# Patient Record
Sex: Male | Born: 1985 | Race: White | Hispanic: No | Marital: Married | Smoking: Current every day smoker
Health system: Southern US, Community
[De-identification: ages and names within clinical notes are randomized; demographics above are authoritative.]

## PROBLEM LIST (undated history)

## (undated) DIAGNOSIS — N200 Calculus of kidney: Secondary | ICD-10-CM

## (undated) DIAGNOSIS — I10 Essential (primary) hypertension: Secondary | ICD-10-CM

## (undated) DIAGNOSIS — F429 Obsessive-compulsive disorder, unspecified: Secondary | ICD-10-CM

## (undated) DIAGNOSIS — L732 Hidradenitis suppurativa: Secondary | ICD-10-CM

## (undated) DIAGNOSIS — E119 Type 2 diabetes mellitus without complications: Secondary | ICD-10-CM

## (undated) DIAGNOSIS — F4521 Hypochondriasis: Secondary | ICD-10-CM

## (undated) HISTORY — PX: WISDOM TOOTH EXTRACTION: SHX21

## (undated) HISTORY — PX: ANKLE SURGERY: SHX546

## (undated) HISTORY — PX: HERNIA REPAIR: SHX51

## (undated) HISTORY — PX: PILONIDAL CYST DRAINAGE: SHX743

---
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
## 2005-05-23 ENCOUNTER — Emergency Department (HOSPITAL_COMMUNITY): Admission: EM | Admit: 2005-05-23 | Discharge: 2005-05-23 | Payer: Self-pay | Admitting: Emergency Medicine

## 2005-05-26 ENCOUNTER — Ambulatory Visit: Payer: Self-pay

## 2012-01-03 ENCOUNTER — Encounter (HOSPITAL_COMMUNITY): Payer: Self-pay

## 2012-01-03 ENCOUNTER — Emergency Department (HOSPITAL_COMMUNITY)
Admission: EM | Admit: 2012-01-03 | Discharge: 2012-01-03 | Disposition: A | Discharge status: Home / Self Care | Payer: Self-pay | Source: Home / Self Care

## 2012-01-03 DIAGNOSIS — K047 Periapical abscess without sinus: Secondary | ICD-10-CM

## 2012-01-03 MED ORDER — IBUPROFEN 800 MG PO TABS: 800.0000 mg | ORAL_TABLET | Freq: Three times a day (TID) | ORAL | Status: AC

## 2012-01-03 MED ORDER — PENICILLIN V POTASSIUM 500 MG PO TABS: 500.0000 mg | ORAL_TABLET | Freq: Four times a day (QID) | ORAL | Status: AC

## 2012-01-03 MED ORDER — CHLORHEXIDINE GLUCONATE 0.12 % MT SOLN: 15.0000 mL | Freq: Two times a day (BID) | OROMUCOSAL | Status: AC

## 2012-01-03 NOTE — ED Provider Notes (Signed)
Medical screening examination/treatment/procedure(s) were performed by non-physician practitioner and as supervising physician I was immediately available for consultation/collaboration.  Luiz Blare MD   Luiz Blare, MD 01/03/12 [phone]

## 2012-01-03 NOTE — Discharge Instructions (Signed)
Your pain is most likely related to a dental abscess.  Please follow up with a dentist for further help.   Dental Abscess A dental abscess usually starts from an infected tooth. Antibiotic medicine and pain pills can be helpful, but dental infections require the attention of a dentist. Rinse around the infected area often with salt water (a pinch of salt in 8 oz of warm water). Do not apply heat to the outside of your face. See your dentist or oral surgeon as soon as possible.  SEEK IMMEDIATE MEDICAL CARE IF:  You have increasing, severe pain that is not relieved by medicine.   You or your child has an oral temperature above 102 F (38.9 C), not controlled by medicine.   Your baby is older than 3 months with a rectal temperature of 102 F (38.9 C) or higher.   Your baby is 62 months old or younger with a rectal temperature of 100.4 F (38 C) or higher.   You develop chills, severe headache, difficulty breathing, or trouble swallowing.   You have swelling in the neck or around the eye.  Document Released: 08/03/2005 Document Revised: 07/23/2011 Document Reviewed: 01/12/2007 [facility] Patient Information 2012 [place], [place].

## 2012-01-03 NOTE — ED Notes (Signed)
Pt has painful, swollen lesion in lt cheek for three weeks, lt ear now starting to hurt.

## 2012-01-03 NOTE — ED Provider Notes (Signed)
History     CSN: [id number]  Arrival date & time 01/03/12  0944   None     Chief Complaint  Patient presents with  . Oral Swelling    (Consider location/radiation/quality/duration/timing/severity/associated sxs/prior treatment) Patient is a 26 y.o. male presenting with tooth pain. The history is provided by the patient.  Dental PainThe primary symptoms include mouth pain. Primary symptoms do not include dental injury, fever, sore throat or cough. Episode onset: 3 weeks ago. The symptoms are worsening. The symptoms are new. The symptoms occur constantly.  Additional symptoms include: gum swelling, gum tenderness and ear pain. Additional symptoms do not include: purulent gums, facial swelling and swollen glands. Associated symptoms comments: Pt feels area swells sometimes, then pt has a bad taste in his mouth and swelling subsides; this then repeats. Feels pain is worsening, and now feels ear is sore sometimes. Medical issues include: smoking.    History reviewed. No pertinent past medical history.  Past Surgical History  Procedure Date  . Pilonidal cyst drainage   . Ankle surgery     History reviewed. No pertinent family history.  History  Substance Use Topics  . Smoking status: Current Everyday Smoker -- 1.0 packs/day  . Smokeless tobacco: Not on file  . Alcohol Use: No      Review of Systems  Constitutional: Negative for fever and chills.  HENT: Positive for ear pain, mouth sores and dental problem. Negative for sore throat, facial swelling, neck pain and ear discharge.   Respiratory: Negative for cough.   Skin: Negative for rash and wound.    Allergies  Review of patient's allergies indicates no known allergies.  Home Medications   Current Outpatient Rx  Name Route Sig Dispense Refill  . CHLORHEXIDINE GLUCONATE 0.12 % MT SOLN Mouth/Throat Use as directed 15 mLs in the mouth or throat 2 (two) times daily. 120 mL 0  . IBUPROFEN 800 MG PO TABS Oral Take 1 tablet  (800 mg total) by mouth 3 (three) times daily. 21 tablet 0  . PENICILLIN V POTASSIUM 500 MG PO TABS Oral Take 1 tablet (500 mg total) by mouth 4 (four) times daily. 40 tablet 0    BP 119/76  Pulse 99  Temp(Src) 97.8 F (36.6 C) (Oral)  Resp 16  SpO2 100%  Physical Exam  Constitutional: He appears well-developed and well-nourished. No distress.       Morbidly obese  HENT:  Right Ear: Tympanic membrane, external ear and ear canal normal.  Left Ear: Tympanic membrane, external ear and ear canal normal.  Mouth/Throat: Oropharynx is clear and moist and mucous membranes are normal.    Lymphadenopathy:       Head (right side): No submental, no submandibular, no tonsillar, no preauricular and no posterior auricular adenopathy present.       Head (left side): No submental, no submandibular, no tonsillar, no preauricular and no posterior auricular adenopathy present.    He has no cervical adenopathy.    ED Course  Procedures (including critical care time)  Labs Reviewed - No data to display No results found.   1. Dental abscess       MDM          Cathlyn Parsons, NP 01/03/12 1147

## 2012-05-27 DIAGNOSIS — Z87442 Personal history of urinary calculi: Secondary | ICD-10-CM | POA: Insufficient documentation

## 2012-05-27 DIAGNOSIS — F17200 Nicotine dependence, unspecified, uncomplicated: Secondary | ICD-10-CM | POA: Insufficient documentation

## 2012-05-27 DIAGNOSIS — N23 Unspecified renal colic: Secondary | ICD-10-CM | POA: Insufficient documentation

## 2012-05-28 ENCOUNTER — Encounter (HOSPITAL_COMMUNITY): Payer: Self-pay | Admitting: Emergency Medicine

## 2012-05-28 ENCOUNTER — Emergency Department (HOSPITAL_COMMUNITY)
Admission: EM | Admit: 2012-05-28 | Discharge: 2012-05-28 | Disposition: A | Discharge status: Home / Self Care | Payer: Self-pay | Attending: Emergency Medicine | Admitting: Emergency Medicine

## 2012-05-28 DIAGNOSIS — N23 Unspecified renal colic: Secondary | ICD-10-CM

## 2012-05-28 LAB — URINALYSIS, ROUTINE W REFLEX MICROSCOPIC
Bilirubin Urine: NEGATIVE
Glucose, UA: NEGATIVE mg/dL
Ketones, ur: NEGATIVE mg/dL
Leukocytes, UA: NEGATIVE
Nitrite: NEGATIVE
Protein, ur: NEGATIVE mg/dL
Specific Gravity, Urine: 1.014 (ref 1.005–1.030)
Urobilinogen, UA: 0.2 mg/dL (ref 0.0–1.0)
pH: 5.5 (ref 5.0–8.0)

## 2012-05-28 LAB — URINE MICROSCOPIC-ADD ON

## 2012-05-28 MED ORDER — FENTANYL CITRATE 0.05 MG/ML IJ SOLN
50.0000 ug | Freq: Once | INTRAMUSCULAR | Status: AC
  Administered 2012-05-28: 50 ug via INTRAVENOUS
  Filled 2012-05-28: qty 2

## 2012-05-28 MED ORDER — ONDANSETRON HCL 4 MG PO TABS: 4.0000 mg | ORAL_TABLET | Freq: Four times a day (QID) | ORAL | Status: DC

## 2012-05-28 MED ORDER — OXYCODONE-ACETAMINOPHEN 5-325 MG PO TABS: 1.0000 | ORAL_TABLET | ORAL | Status: DC | PRN

## 2012-05-28 NOTE — ED Provider Notes (Signed)
History     CSN: [id number]  Arrival date & time 05/27/12  2353   First MD Initiated Contact with Patient 05/28/12 0236      Chief Complaint  Patient presents with  . Flank Pain    (Consider location/radiation/quality/duration/timing/severity/associated sxs/prior treatment) Patient is a 26 y.o. male presenting with flank pain. The history is provided by the patient.  Flank Pain This is a new problem. The current episode started today. The problem occurs constantly. Associated symptoms include nausea and vomiting. Pertinent negatives include no chills or fever. Associated symptoms comments: Right flank pain that started over the last several days and yesterday morning woke patient after it radiated with significant pain into right groin area. Current symptoms are similar to previous history of kidney stones. No fever. . Nothing aggravates the symptoms.    History reviewed. No pertinent past medical history.  Past Surgical History  Procedure Date  . Pilonidal cyst drainage   . Ankle surgery     No family history on file.  History  Substance Use Topics  . Smoking status: Current Every Day Smoker -- 1.0 packs/day  . Smokeless tobacco: Not on file  . Alcohol Use: No      Review of Systems  Constitutional: Negative for fever and chills.  HENT: Negative.   Respiratory: Negative.   Cardiovascular: Negative.   Gastrointestinal: Positive for nausea and vomiting.  Genitourinary: Positive for flank pain.  Musculoskeletal: Negative.   Skin: Negative.   Neurological: Negative.     Allergies  Review of patient's allergies indicates no known allergies.  Home Medications   Current Outpatient Rx  Name Route Sig Dispense Refill  . ACETAMINOPHEN 500 MG PO TABS Oral Take 1,000 mg by mouth every 6 (six) hours as needed. For pain    . OMEPRAZOLE 20 MG PO CPDR Oral Take 20 mg by mouth daily.      BP 147/82  Pulse 99  Resp 16  Wt 285 lb (129.275 kg)  SpO2 98%  Physical  Exam  Constitutional: He is oriented to person, place, and time. He appears well-developed and well-nourished.  HENT:  Head: Normocephalic.  Neck: Normal range of motion. Neck supple.  Cardiovascular: Normal rate and regular rhythm.   Pulmonary/Chest: Effort normal and breath sounds normal.  Abdominal: Soft. Bowel sounds are normal. There is no tenderness. There is no rebound and no guarding.  Genitourinary:       Penis without discharge. No testicular swelling, mass or tenderness.   Musculoskeletal: Normal range of motion.  Neurological: He is alert and oriented to person, place, and time.  Skin: Skin is warm and dry. No rash noted.  Psychiatric: He has a normal mood and affect.    ED Course  Procedures (including critical care time)  Labs Reviewed  URINALYSIS, ROUTINE W REFLEX MICROSCOPIC - Abnormal; Notable for the following:    APPearance CLOUDY (*)     Hgb urine dipstick LARGE (*)     All other components within normal limits  URINE MICROSCOPIC-ADD ON   Results for orders placed during the hospital encounter of 05/28/12  URINALYSIS, ROUTINE W REFLEX MICROSCOPIC      Component Value Range   Color, Urine YELLOW  YELLOW   APPearance CLOUDY (*) CLEAR   Specific Gravity, Urine 1.014  1.005 - 1.030   pH 5.5  5.0 - 8.0   Glucose, UA NEGATIVE  NEGATIVE mg/dL   Hgb urine dipstick LARGE (*) NEGATIVE   Bilirubin Urine NEGATIVE  NEGATIVE  Ketones, ur NEGATIVE  NEGATIVE mg/dL   Protein, ur NEGATIVE  NEGATIVE mg/dL   Urobilinogen, UA 0.2  0.0 - 1.0 mg/dL   Nitrite NEGATIVE  NEGATIVE   Leukocytes, UA NEGATIVE  NEGATIVE  URINE MICROSCOPIC-ADD ON      Component Value Range   RBC / HPF TOO NUMEROUS TO COUNT  <3 RBC/hpf   Bacteria, UA RARE  RARE    No results found.   No diagnosis found.  1. Ureteral colic   MDM  Patient with a history of kidney stones with similar pain that is well controlled in ED. No vomiting. Felt CT scan would not contribute to treatment and will  discharge with referral to urology.        Rodena Medin, PA-C 05/28/12 [phone]

## 2012-05-28 NOTE — ED Notes (Signed)
Pt alert, arrives from home, c/o right flank pain, onset several days ago, states hx of stones, resp even unlabored, skin pwd

## 2012-05-28 NOTE — ED Provider Notes (Signed)
Medical screening examination/treatment/procedure(s) were performed by non-physician practitioner and as supervising physician I was immediately available for consultation/collaboration.  Tobin Chad, MD 05/28/12 [phone]

## 2012-05-28 NOTE — ED Notes (Signed)
Pt states that he has a kidney stone. He has not seen a MD but has a hx of multiple stones.

## 2012-06-03 ENCOUNTER — Encounter (HOSPITAL_COMMUNITY): Payer: Self-pay | Admitting: Emergency Medicine

## 2012-06-03 ENCOUNTER — Emergency Department (HOSPITAL_COMMUNITY)
Admission: EM | Admit: 2012-06-03 | Discharge: 2012-06-04 | Disposition: A | Discharge status: Home / Self Care | Payer: Self-pay | Attending: Emergency Medicine | Admitting: Emergency Medicine

## 2012-06-03 DIAGNOSIS — F17200 Nicotine dependence, unspecified, uncomplicated: Secondary | ICD-10-CM | POA: Insufficient documentation

## 2012-06-03 DIAGNOSIS — N23 Unspecified renal colic: Secondary | ICD-10-CM

## 2012-06-03 DIAGNOSIS — N201 Calculus of ureter: Secondary | ICD-10-CM | POA: Insufficient documentation

## 2012-06-03 NOTE — ED Notes (Signed)
Pt c/o right flank pain that started last Friday  Pt was seen here and was diagnosed with a kidney stone  Pt was seen by urology on Monday and found a 4mm stone   Pt states yesterday the pain started to increase  Pt states the pain is localized in one place  Pt is c/o nausea  Pt states he used his medication for the nausea   Pt states he is having urinary frequency and states it is very painful to urinate

## 2012-06-04 LAB — URINALYSIS, ROUTINE W REFLEX MICROSCOPIC
Bilirubin Urine: NEGATIVE
Glucose, UA: NEGATIVE mg/dL
Ketones, ur: NEGATIVE mg/dL
Protein, ur: NEGATIVE mg/dL
Specific Gravity, Urine: 1.019 (ref 1.005–1.030)
pH: 6 (ref 5.0–8.0)

## 2012-06-04 LAB — URINE MICROSCOPIC-ADD ON

## 2012-06-04 MED ORDER — HYDROMORPHONE HCL PF 2 MG/ML IJ SOLN
2.0000 mg | Freq: Once | INTRAMUSCULAR | Status: AC
  Administered 2012-06-04: 2 mg via INTRAVENOUS
  Filled 2012-06-04: qty 1

## 2012-06-04 MED ORDER — NAPROXEN SODIUM 220 MG PO TABS: ORAL_TABLET | ORAL | Status: DC

## 2012-06-04 MED ORDER — ONDANSETRON 8 MG PO TBDP
8.0000 mg | ORAL_TABLET | Freq: Once | ORAL | Status: AC
  Administered 2012-06-04: 8 mg via ORAL
  Filled 2012-06-04: qty 2

## 2012-06-04 MED ORDER — HYDROMORPHONE HCL 4 MG PO TABS: 4.0000 mg | ORAL_TABLET | ORAL | Status: DC | PRN

## 2012-06-04 MED ORDER — IBUPROFEN 800 MG PO TABS
800.0000 mg | ORAL_TABLET | Freq: Once | ORAL | Status: AC
Start: 2012-06-04 — End: 2012-06-04
  Administered 2012-06-04: 800 mg via ORAL
  Filled 2012-06-04: qty 1

## 2012-06-04 NOTE — ED Provider Notes (Signed)
History     CSN: [id number]  Arrival date & time 06/03/12  2337   First MD Initiated Contact with Patient 06/04/12 0058      Chief Complaint  Patient presents with  . Flank Pain    (Consider location/radiation/quality/duration/timing/severity/associated sxs/prior treatment) HPI This is a 26 year old white male with a history of nephrolithiasis. He's had a one-week history of right flank pain. He was seen in the ED for this and subsequently seen by urology 5 days ago. He has a known 4 mm right ureteral stone. He is here because the pain is worsened over the last 2 days and changed in character. He states before it was a dull pain but is now a sharp pain. It is felt primarily in his right testicle, radiating to the right flank. He also has sensation of urinary urgency and experiences urethral pain with urination. He has not gotten adequate relief with his oxycodone and rapid flow at home. He is not aware of having a fever. He has had nausea with this.  History reviewed. No pertinent past medical history.  Past Surgical History  Procedure Date  . Pilonidal cyst drainage   . Ankle surgery     Family History  Problem Relation Age of Onset  . Cancer Other   . Hypertension Other     History  Substance Use Topics  . Smoking status: Current Every Day Smoker -- 1.0 packs/day    Types: Cigarettes  . Smokeless tobacco: Not on file  . Alcohol Use: No      Review of Systems  All other systems reviewed and are negative.    Allergies  Review of patient's allergies indicates no known allergies.  Home Medications   Current Outpatient Rx  Name Route Sig Dispense Refill  . ACETAMINOPHEN 500 MG PO TABS Oral Take 1,000 mg by mouth every 6 (six) hours as needed. For pain    . OMEPRAZOLE 20 MG PO CPDR Oral Take 20 mg by mouth daily.    Marland Kitchen ONDANSETRON HCL 4 MG PO TABS Oral Take 4 mg by mouth every 8 (eight) hours as needed. For nausea    . OXYCODONE-ACETAMINOPHEN 5-325 MG PO TABS Oral  Take 1 tablet by mouth every 4 (four) hours as needed for pain. 15 tablet 0  . SILODOSIN 4 MG PO CAPS Oral Take 4 mg by mouth daily with breakfast.      BP 154/89  Pulse 107  Temp 98.1 F (36.7 C) (Oral)  Resp 20  SpO2 97%  Physical Exam General: Well-developed, obese male in no acute distress; appearance consistent with age of record HENT: normocephalic, atraumatic Eyes: pupils equal round and reactive to light; extraocular muscles intact Neck: supple Heart: regular rate and rhythm Lungs: clear to auscultation bilaterally Abdomen: soft; obese; nontender; bowel sounds present GU: Right flank tenderness Extremities: No deformity; full range of motion; trace edema of lower legs Neurologic: Awake, alert and oriented; motor function intact in all extremities and symmetric; no facial droop Skin: Warm and dry Psychiatric: Normal mood and affect    ED Course  Procedures (including critical care time)     MDM   Nursing notes and vitals signs, including pulse oximetry, reviewed.  Summary of this visit's results, reviewed by myself:  Labs:  Results for orders placed during the hospital encounter of 06/03/12  URINALYSIS, ROUTINE W REFLEX MICROSCOPIC      Component Value Range   Color, Urine YELLOW  YELLOW   APPearance CLEAR  CLEAR  Specific Gravity, Urine 1.019  1.005 - 1.030   pH 6.0  5.0 - 8.0   Glucose, UA NEGATIVE  NEGATIVE mg/dL   Hgb urine dipstick MODERATE (*) NEGATIVE   Bilirubin Urine NEGATIVE  NEGATIVE   Ketones, ur NEGATIVE  NEGATIVE mg/dL   Protein, ur NEGATIVE  NEGATIVE mg/dL   Urobilinogen, UA 0.2  0.0 - 1.0 mg/dL   Nitrite NEGATIVE  NEGATIVE   Leukocytes, UA SMALL (*) NEGATIVE  URINE MICROSCOPIC-ADD ON      Component Value Range   Squamous Epithelial / LPF RARE  RARE   WBC, UA 3-6  <3 WBC/hpf   RBC / HPF 11-20  <3 RBC/hpf   2:08 AM Pain improved after IM Dilaudid and ibuprofen by mouth. We will adjust his medications.        Hanley Seamen,  MD 06/04/12 [phone]

## 2012-06-05 LAB — URINE CULTURE
Colony Count: NO GROWTH
Culture: NO GROWTH

## 2012-09-11 ENCOUNTER — Encounter (HOSPITAL_COMMUNITY): Payer: Self-pay | Admitting: *Deleted

## 2012-09-11 ENCOUNTER — Emergency Department (HOSPITAL_COMMUNITY)
Admission: EM | Admit: 2012-09-11 | Discharge: 2012-09-11 | Disposition: A | Discharge status: Home / Self Care | Payer: Self-pay | Attending: Emergency Medicine | Admitting: Emergency Medicine

## 2012-09-11 DIAGNOSIS — R110 Nausea: Secondary | ICD-10-CM | POA: Insufficient documentation

## 2012-09-11 DIAGNOSIS — N390 Urinary tract infection, site not specified: Secondary | ICD-10-CM | POA: Insufficient documentation

## 2012-09-11 DIAGNOSIS — M549 Dorsalgia, unspecified: Secondary | ICD-10-CM | POA: Insufficient documentation

## 2012-09-11 DIAGNOSIS — F17200 Nicotine dependence, unspecified, uncomplicated: Secondary | ICD-10-CM | POA: Insufficient documentation

## 2012-09-11 DIAGNOSIS — N200 Calculus of kidney: Secondary | ICD-10-CM | POA: Insufficient documentation

## 2012-09-11 DIAGNOSIS — R319 Hematuria, unspecified: Secondary | ICD-10-CM | POA: Insufficient documentation

## 2012-09-11 DIAGNOSIS — R350 Frequency of micturition: Secondary | ICD-10-CM | POA: Insufficient documentation

## 2012-09-11 DIAGNOSIS — R300 Dysuria: Secondary | ICD-10-CM | POA: Insufficient documentation

## 2012-09-11 DIAGNOSIS — Z79899 Other long term (current) drug therapy: Secondary | ICD-10-CM | POA: Insufficient documentation

## 2012-09-11 DIAGNOSIS — N23 Unspecified renal colic: Secondary | ICD-10-CM | POA: Insufficient documentation

## 2012-09-11 DIAGNOSIS — E669 Obesity, unspecified: Secondary | ICD-10-CM | POA: Insufficient documentation

## 2012-09-11 DIAGNOSIS — R3915 Urgency of urination: Secondary | ICD-10-CM | POA: Insufficient documentation

## 2012-09-11 DIAGNOSIS — R109 Unspecified abdominal pain: Secondary | ICD-10-CM

## 2012-09-11 HISTORY — DX: Calculus of kidney: N20.0

## 2012-09-11 LAB — URINALYSIS, ROUTINE W REFLEX MICROSCOPIC
Bilirubin Urine: NEGATIVE
Glucose, UA: NEGATIVE mg/dL
Hgb urine dipstick: NEGATIVE
Nitrite: NEGATIVE
Protein, ur: NEGATIVE mg/dL
Urobilinogen, UA: 1 mg/dL (ref 0.0–1.0)
pH: 8.5 — ABNORMAL HIGH (ref 5.0–8.0)

## 2012-09-11 LAB — URINE MICROSCOPIC-ADD ON

## 2012-09-11 MED ORDER — OXYCODONE-ACETAMINOPHEN 5-325 MG PO TABS
2.0000 | ORAL_TABLET | Freq: Once | ORAL | Status: AC
  Administered 2012-09-11: 2 via ORAL
  Filled 2012-09-11: qty 2

## 2012-09-11 MED ORDER — OXYCODONE-ACETAMINOPHEN 5-325 MG PO TABS: 1.0000 | ORAL_TABLET | Freq: Four times a day (QID) | ORAL | Status: DC | PRN

## 2012-09-11 MED ORDER — CIPROFLOXACIN HCL 500 MG PO TABS: 500.0000 mg | ORAL_TABLET | Freq: Two times a day (BID) | ORAL | Status: DC

## 2012-09-11 NOTE — ED Notes (Signed)
Pt reports having a kidney stone, hx of same. Having left side back pain, groin pain, blood in urine, nausea, difficulty urinating x 3 days.

## 2012-09-11 NOTE — ED Provider Notes (Signed)
Medical screening examination/treatment/procedure(s) were performed by non-physician practitioner and as supervising physician I was immediately available for consultation/collaboration.  Tobin Chad, MD 09/11/12 [phone]

## 2012-09-11 NOTE — ED Provider Notes (Signed)
History     CSN: [id number]  Arrival date & time 09/11/12  1413   First MD Initiated Contact with Patient 09/11/12 1425      Chief Complaint  Patient presents with  . Nephrolithiasis    (Consider location/radiation/quality/duration/timing/severity/associated sxs/prior treatment) HPI Comments: 27 y/o male with a history of kidney stones presents to the ED complaining of left flank pain x 4 days. Flank pain described as constant and achy rated 7/10 radiating to his groin intermittently. Admits to associated nausea without vomiting. Also states he had hematuria yesterday along with dysuria, increased frequency and urgency. He tried taking zofran for nausea which has helped as well as percocet from his last kidney stone which provided relief. He has a history of kidney stones on left verified by CT scan at [facility]. He has been able to pass his prior stones. Denies fever or chills.  The history is provided by the patient.    Past Medical History  Diagnosis Date  . Kidney stone     Past Surgical History  Procedure Date  . Pilonidal cyst drainage   . Ankle surgery     Family History  Problem Relation Age of Onset  . Cancer Other   . Hypertension Other     History  Substance Use Topics  . Smoking status: Current Every Day Smoker -- 1.0 packs/day    Types: Cigarettes  . Smokeless tobacco: Not on file  . Alcohol Use: No      Review of Systems  Constitutional: Negative for fever and chills.  Gastrointestinal: Positive for nausea. Negative for vomiting and diarrhea.  Genitourinary: Positive for dysuria, urgency, frequency, hematuria and flank pain.  Musculoskeletal: Positive for back pain.  All other systems reviewed and are negative.    Allergies  Review of patient's allergies indicates no known allergies.  Home Medications   Current Outpatient Rx  Name  Route  Sig  Dispense  Refill  . ACETAMINOPHEN 500 MG PO TABS   Oral   Take 1,000 mg by mouth every 6  (six) hours as needed. For pain         . HYDROMORPHONE HCL 4 MG PO TABS   Oral   Take 1 tablet (4 mg total) by mouth every 4 (four) hours as needed for pain.   30 tablet   0   . NAPROXEN SODIUM 220 MG PO TABS      Take 2 tablets twice daily until stone passes. Best taken with a meal.         . OMEPRAZOLE 20 MG PO CPDR   Oral   Take 20 mg by mouth daily.         Marland Kitchen ONDANSETRON HCL 4 MG PO TABS   Oral   Take 4 mg by mouth every 8 (eight) hours as needed. For nausea         . OXYCODONE-ACETAMINOPHEN 5-325 MG PO TABS   Oral   Take 1 tablet by mouth every 4 (four) hours as needed for pain.   15 tablet   0   . SILODOSIN 4 MG PO CAPS   Oral   Take 4 mg by mouth daily with breakfast.           BP 142/89  Pulse 103  Temp 97.7 F (36.5 C) (Oral)  Resp 18  SpO2 98%  Physical Exam  Nursing note and vitals reviewed. Constitutional: He is oriented to person, place, and time. He appears well-developed. No distress.  Obese   HENT:  Head: Normocephalic and atraumatic.  Mouth/Throat: Oropharynx is clear and moist.  Eyes: Conjunctivae normal are normal.  Neck: Normal range of motion. Neck supple.  Cardiovascular: Regular rhythm, normal heart sounds and intact distal pulses.  Tachycardia present.   Pulmonary/Chest: Effort normal and breath sounds normal.  Abdominal: Soft. Bowel sounds are normal. There is CVA tenderness (left). There is no rigidity, no rebound and no guarding.    Musculoskeletal: Normal range of motion. He exhibits no edema.  Neurological: He is alert and oriented to person, place, and time.  Skin: Skin is warm and dry.  Psychiatric: He has a normal mood and affect. His behavior is normal.    ED Course  Procedures (including critical care time)  Labs Reviewed  URINALYSIS, ROUTINE W REFLEX MICROSCOPIC - Abnormal; Notable for the following:    pH 8.5 (*)     Leukocytes, UA SMALL (*)     All other components within normal limits  URINE  MICROSCOPIC-ADD ON - Abnormal; Notable for the following:    Squamous Epithelial / LPF FEW (*)     All other components within normal limits   No results found.   1. Ureteral colic   2. Flank pain   3. UTI (urinary tract infection)       MDM  27 y/o male with flank pain the same as his prior kidney stones. CT obtained by his urologist confirming stone on left per patient. Urine with UTI. Will treat with cipro. Percocet given for pain in ED. Rx percocet for pain. Urine strainer given. Return precautions discussed. Patient stable for discharge.         Trevor Mace, PA-C 09/11/12 1512

## 2013-02-18 ENCOUNTER — Encounter (HOSPITAL_COMMUNITY): Payer: Self-pay | Admitting: *Deleted

## 2013-02-18 ENCOUNTER — Emergency Department (HOSPITAL_COMMUNITY)
Admission: EM | Admit: 2013-02-18 | Discharge: 2013-02-18 | Disposition: A | Discharge status: Home / Self Care | Payer: Self-pay | Attending: Emergency Medicine | Admitting: Emergency Medicine

## 2013-02-18 DIAGNOSIS — K0889 Other specified disorders of teeth and supporting structures: Secondary | ICD-10-CM

## 2013-02-18 DIAGNOSIS — K089 Disorder of teeth and supporting structures, unspecified: Secondary | ICD-10-CM | POA: Insufficient documentation

## 2013-02-18 DIAGNOSIS — J029 Acute pharyngitis, unspecified: Secondary | ICD-10-CM

## 2013-02-18 DIAGNOSIS — Z87442 Personal history of urinary calculi: Secondary | ICD-10-CM | POA: Insufficient documentation

## 2013-02-18 DIAGNOSIS — R0982 Postnasal drip: Secondary | ICD-10-CM | POA: Insufficient documentation

## 2013-02-18 DIAGNOSIS — F17200 Nicotine dependence, unspecified, uncomplicated: Secondary | ICD-10-CM | POA: Insufficient documentation

## 2013-02-18 LAB — RAPID STREP SCREEN (MED CTR MEBANE ONLY): Streptococcus, Group A Screen (Direct): NEGATIVE

## 2013-02-18 MED ORDER — OXYCODONE-ACETAMINOPHEN 5-325 MG PO TABS: 1.0000 | ORAL_TABLET | ORAL | Status: DC | PRN

## 2013-02-18 MED ORDER — AMOXICILLIN 500 MG PO CAPS: 500.0000 mg | ORAL_CAPSULE | Freq: Three times a day (TID) | ORAL | Status: DC

## 2013-02-18 MED ORDER — IBUPROFEN 800 MG PO TABS: 800.0000 mg | ORAL_TABLET | Freq: Three times a day (TID) | ORAL | Status: DC

## 2013-02-18 NOTE — ED Provider Notes (Signed)
History    This chart was scribed for Arthor Captain, non-physician practitioner working with Dione Booze, MD by Leone Payor, ED Scribe. This patient was seen in room TR08C/TR08C and the patient's care was started at 1859.  CSN: [id number] Arrival date & time 02/18/13  1859  First MD Initiated Contact with Patient 02/18/13 1918     Chief Complaint  Patient presents with  . 2 complaints     The history is provided by the patient. No language interpreter was used.    HPI Comments: Andrew Gibson is a 27 y.o. male who presents to the Emergency Department complaining of a constant, gradually worsening, L upper dental pain starting 2 weeks ago. States he has a broken tooth that has been chipping away recently. He describes the pain as dull and throbbing. Pressure and sweet foods aggravate the pain. Hot and cold do not affect the pain.   Pt also complains of a gradual onset, gradually worsening, constant sore throat starting 4 days ago. He denies any difficulty swallowing. He has noticed some drainage in the back of throat. Pt has h/o strep throat. He denies fever, chills, rashes, myalgia, joint pain, coughing, wheezing, chest pain.      Past Medical History  Diagnosis Date  . Kidney stone    Past Surgical History  Procedure Laterality Date  . Pilonidal cyst drainage    . Ankle surgery     Family History  Problem Relation Age of Onset  . Cancer Other   . Hypertension Other    History  Substance Use Topics  . Smoking status: Current Every Day Smoker -- 1.00 packs/day    Types: Cigarettes  . Smokeless tobacco: Not on file  . Alcohol Use: No    Review of Systems  Constitutional: Negative for fever and chills.  HENT: Positive for sore throat, dental problem and postnasal drip. Negative for trouble swallowing.   Respiratory: Negative for cough and wheezing.   Cardiovascular: Negative for chest pain.  Musculoskeletal: Negative for myalgias and joint swelling.  Skin: Negative for  rash.    Allergies  Review of patient's allergies indicates no known allergies.  Home Medications   Current Outpatient Rx  Name  Route  Sig  Dispense  Refill  . ibuprofen (ADVIL,MOTRIN) 200 MG tablet   Oral   Take 800-1,000 mg by mouth daily as needed. For pain         . Menthol (HALLS COUGH DROPS) 5.8 MG LOZG   Mouth/Throat   Use as directed 1 lozenge in the mouth or throat daily as needed. For sore throat         . Pseudoephedrine-APAP-DM (DAYQUIL PO)   Oral   Take 1 capsule by mouth daily as needed. For cold symptoms          BP   Pulse 89  Temp(Src) 97 F (36.1 C) (Oral)  Resp 18  SpO2 97% Physical Exam  Nursing note and vitals reviewed. Constitutional: He is oriented to person, place, and time. He appears well-developed and well-nourished. No distress.  HENT:  Head: Normocephalic and atraumatic.  Mouth/Throat: Oropharyngeal exudate present.  Cracked R upper molar. No erythema in the gumline, no drainage or induration.   Tonsillar exudate. Some swelling and erythema. Uvula is midline. Airway is patent.  Eyes: EOM are normal.  Neck: Neck supple. No tracheal deviation present.  Cardiovascular: Normal rate, regular rhythm and normal heart sounds.   Pulmonary/Chest: Effort normal and breath sounds normal. No respiratory distress.  He has no wheezes. He has no rales. He exhibits no tenderness.  Musculoskeletal: Normal range of motion.  Lymphadenopathy:    He has no cervical adenopathy.  Neurological: He is alert and oriented to person, place, and time.  Skin: Skin is warm and dry.  Psychiatric: He has a normal mood and affect. His behavior is normal.    ED Course  Procedures (including critical care time)  DIAGNOSTIC STUDIES: Oxygen Saturation is 97% on RA, normal by my interpretation.    COORDINATION OF CARE: 7:28 PM Discussed treatment plan with pt at bedside and pt agreed to plan.   Labs Reviewed  RAPID STREP SCREEN   No results found. 1. Pain,  dental   2. Pharyngitis     MDM  Patient with toothache.  No gross abscess.  Exam unconcerning for Ludwig's angina or spread of infection.  Will treat with penicillin and pain medicine.  Urged patient to follow-up with dentist.   Pharyngitis with exudate. Negative strep. D/c with amoxicillin for dental infection. Pain meds and dental f/u .  I personally performed the services described in this documentation, which was scribed in my presence. The recorded information has been reviewed and is accurate.    Arthor Captain, PA-C 02/18/13 2330

## 2013-02-18 NOTE — ED Provider Notes (Signed)
Medical screening examination/treatment/procedure(s) were performed by non-physician practitioner and as supervising physician I was immediately available for consultation/collaboration.   Dione Booze, MD 02/18/13 [phone]

## 2013-02-18 NOTE — ED Notes (Signed)
The pt has hjad a toothache for 2 weeks and a sore throat since wednesday

## 2013-02-20 LAB — CULTURE, GROUP A STREP

## 2013-06-02 ENCOUNTER — Emergency Department (HOSPITAL_COMMUNITY): Payer: Self-pay

## 2013-06-02 ENCOUNTER — Encounter (HOSPITAL_COMMUNITY): Payer: Self-pay | Admitting: Emergency Medicine

## 2013-06-02 ENCOUNTER — Emergency Department (HOSPITAL_COMMUNITY)
Admission: EM | Admit: 2013-06-02 | Discharge: 2013-06-02 | Disposition: A | Discharge status: Home / Self Care | Payer: Self-pay | Attending: Emergency Medicine | Admitting: Emergency Medicine

## 2013-06-02 DIAGNOSIS — M545 Low back pain, unspecified: Secondary | ICD-10-CM | POA: Insufficient documentation

## 2013-06-02 DIAGNOSIS — K439 Ventral hernia without obstruction or gangrene: Secondary | ICD-10-CM

## 2013-06-02 DIAGNOSIS — Y92410 Unspecified street and highway as the place of occurrence of the external cause: Secondary | ICD-10-CM | POA: Insufficient documentation

## 2013-06-02 DIAGNOSIS — Z79899 Other long term (current) drug therapy: Secondary | ICD-10-CM | POA: Insufficient documentation

## 2013-06-02 DIAGNOSIS — Y9389 Activity, other specified: Secondary | ICD-10-CM | POA: Insufficient documentation

## 2013-06-02 DIAGNOSIS — S301XXA Contusion of abdominal wall, initial encounter: Secondary | ICD-10-CM | POA: Insufficient documentation

## 2013-06-02 DIAGNOSIS — K449 Diaphragmatic hernia without obstruction or gangrene: Secondary | ICD-10-CM | POA: Insufficient documentation

## 2013-06-02 DIAGNOSIS — Z87442 Personal history of urinary calculi: Secondary | ICD-10-CM | POA: Insufficient documentation

## 2013-06-02 DIAGNOSIS — F17200 Nicotine dependence, unspecified, uncomplicated: Secondary | ICD-10-CM | POA: Insufficient documentation

## 2013-06-02 LAB — CBC WITH DIFFERENTIAL/PLATELET
Basophils Absolute: 0 10*3/uL (ref 0.0–0.1)
Basophils Relative: 0 % (ref 0–1)
Eosinophils Absolute: 0.3 10*3/uL (ref 0.0–0.7)
Eosinophils Relative: 3 % (ref 0–5)
HCT: 40.2 % (ref 39.0–52.0)
Hemoglobin: 13.4 g/dL (ref 13.0–17.0)
Lymphocytes Relative: 24 % (ref 12–46)
Lymphs Abs: 2.5 10*3/uL (ref 0.7–4.0)
MCH: 27.5 pg (ref 26.0–34.0)
MCHC: 33.3 g/dL (ref 30.0–36.0)
MCV: 82.4 fL (ref 78.0–100.0)
Monocytes Absolute: 0.5 10*3/uL (ref 0.1–1.0)
Monocytes Relative: 5 % (ref 3–12)
Neutro Abs: 7 10*3/uL (ref 1.7–7.7)
Neutrophils Relative %: 68 % (ref 43–77)
Platelets: 274 10*3/uL (ref 150–400)
RBC: 4.88 MIL/uL (ref 4.22–5.81)
RDW: 14.7 % (ref 11.5–15.5)
WBC: 10.3 10*3/uL (ref 4.0–10.5)

## 2013-06-02 LAB — URINALYSIS, ROUTINE W REFLEX MICROSCOPIC
Bilirubin Urine: NEGATIVE
Glucose, UA: NEGATIVE mg/dL
Hgb urine dipstick: NEGATIVE
Ketones, ur: NEGATIVE mg/dL
Nitrite: NEGATIVE
Protein, ur: NEGATIVE mg/dL
Specific Gravity, Urine: 1.02 (ref 1.005–1.030)
Urobilinogen, UA: 1 mg/dL (ref 0.0–1.0)
pH: 7 (ref 5.0–8.0)

## 2013-06-02 LAB — COMPREHENSIVE METABOLIC PANEL
ALT: 35 U/L (ref 0–53)
AST: 25 U/L (ref 0–37)
Albumin: 3.5 g/dL (ref 3.5–5.2)
Alkaline Phosphatase: 117 U/L (ref 39–117)
BUN: 7 mg/dL (ref 6–23)
CO2: 27 mEq/L (ref 19–32)
Calcium: 9.2 mg/dL (ref 8.4–10.5)
Chloride: 99 mEq/L (ref 96–112)
Creatinine, Ser: 0.75 mg/dL (ref 0.50–1.35)
GFR calc Af Amer: >90 mL/min (ref >90)
GFR calc non Af Amer: >90 mL/min (ref >90)
Glucose, Bld: 104 mg/dL — ABNORMAL HIGH (ref 70–99)
Potassium: 3.7 mEq/L (ref 3.5–5.1)
Sodium: 137 mEq/L (ref 135–145)
Total Bilirubin: 0.4 mg/dL (ref 0.3–1.2)
Total Protein: 8 g/dL (ref 6.0–8.3)

## 2013-06-02 LAB — URINE MICROSCOPIC-ADD ON

## 2013-06-02 MED ORDER — IBUPROFEN 800 MG PO TABS
800.0000 mg | ORAL_TABLET | Freq: Once | ORAL | Status: AC
  Administered 2013-06-02: 800 mg via ORAL
  Filled 2013-06-02: qty 1

## 2013-06-02 MED ORDER — IOHEXOL 300 MG/ML  SOLN
100.0000 mL | Freq: Once | INTRAMUSCULAR | Status: AC | PRN
  Administered 2013-06-02: 100 mL via INTRAVENOUS

## 2013-06-02 MED ORDER — IBUPROFEN 800 MG PO TABS: 800.0000 mg | ORAL_TABLET | Freq: Three times a day (TID) | ORAL | Status: DC | PRN
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
## 2013-06-02 NOTE — ED Notes (Signed)
Discharge instructions reviewed with pt. Pt verbalized understanding.   
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
## 2013-06-07 NOTE — ED Provider Notes (Signed)
Medical screening examination/treatment/procedure(s) were performed by non-physician practitioner and as supervising physician I was immediately available for consultation/collaboration.  EKG Interpretation   None        Raeford Razor, MD 06/07/13 [phone]

## 2014-07-05 DIAGNOSIS — K429 Umbilical hernia without obstruction or gangrene: Secondary | ICD-10-CM | POA: Insufficient documentation

## 2014-07-07 IMAGING — CT CT ABD-PELV W/ CM
2 of 5 series · 16 of 46 positions shown, 18 images · IV contrast (CONTRAST)
Comparison: May 30, 2012

CLINICAL DATA: Abdominal pain; recent trauma

EXAM:
CT ABDOMEN AND PELVIS WITH CONTRAST
TECHNIQUE: Multidetector CT imaging of the abdomen and pelvis was performed
using the standard protocol following bolus administration of
intravenous contrast.
CONTRAST:  100mL OMNIPAQUE IOHEXOL 300 MG/ML  SOLN

[Series 2: routine · axial · 0.92mm/px · z∈[-854,-450]mm · 13 of 93 slices shown, 15 images]
[im 6/93  soft-tissue]
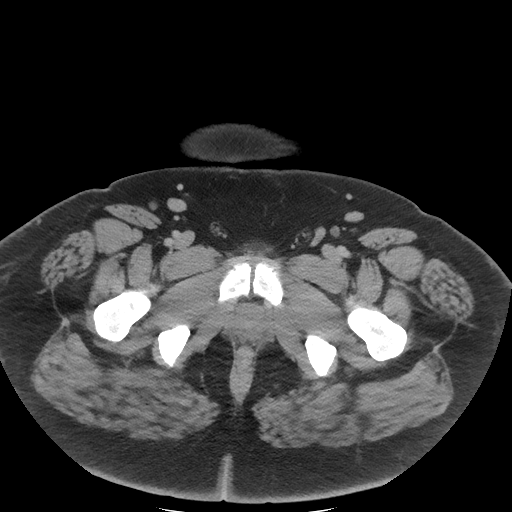
[im 6/93  bone]
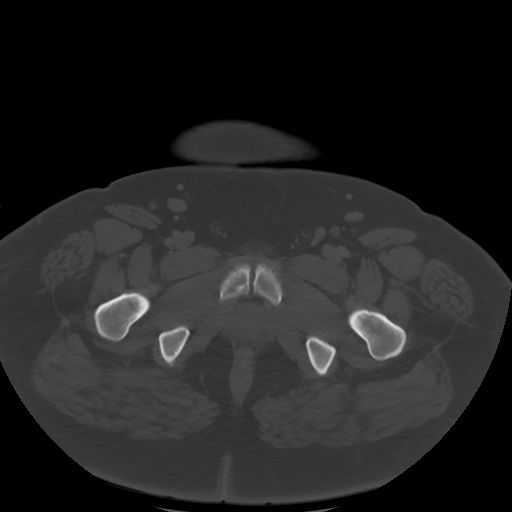
[im 11/93  soft-tissue]
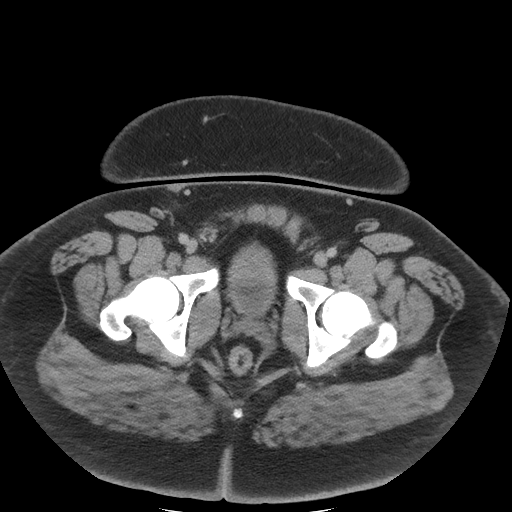
[im 21/93  soft-tissue]
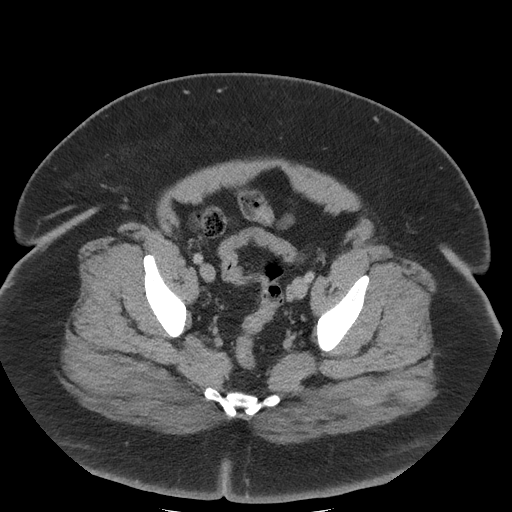
[im 26/93  soft-tissue]
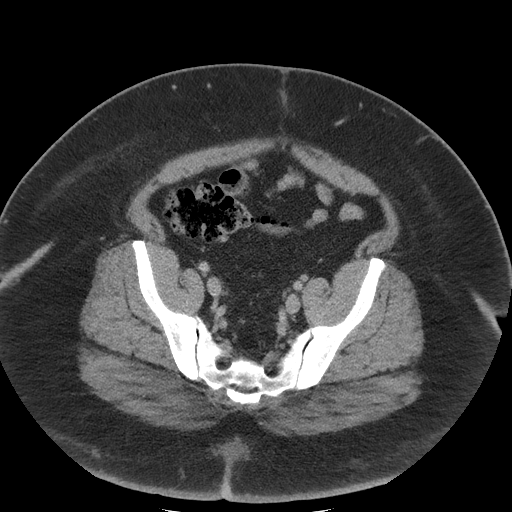
[im 31/93  soft-tissue]
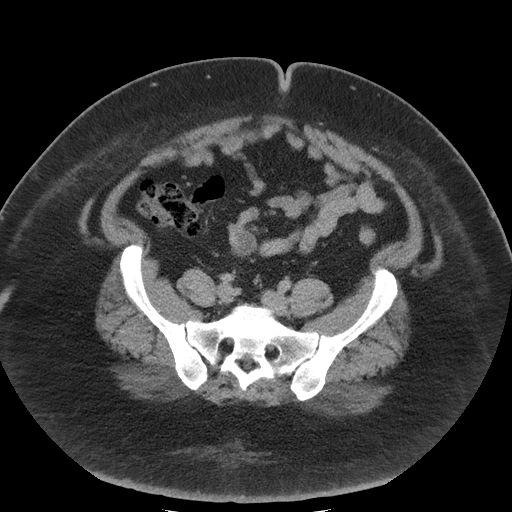
[im 41/93  soft-tissue]
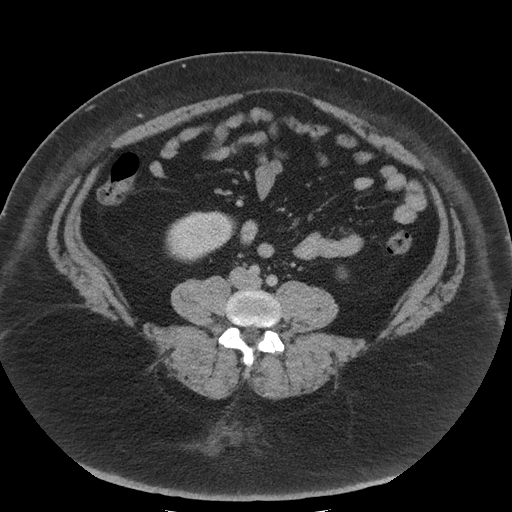
[im 47/93  soft-tissue]
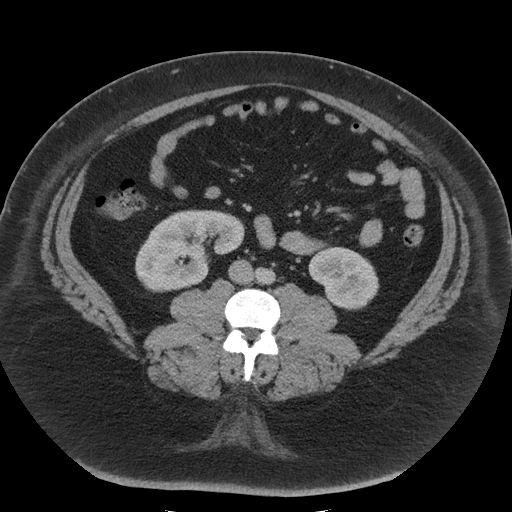
[im 52/93  soft-tissue]
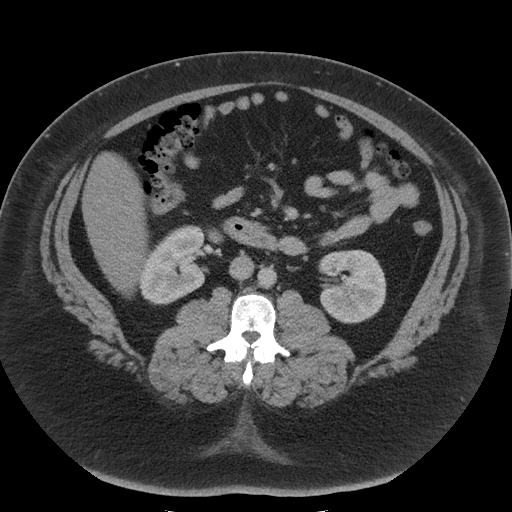
[im 62/93  soft-tissue]
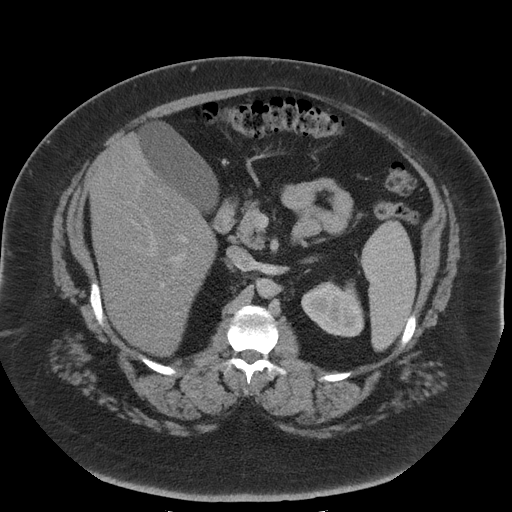
[im 62/93  bone]
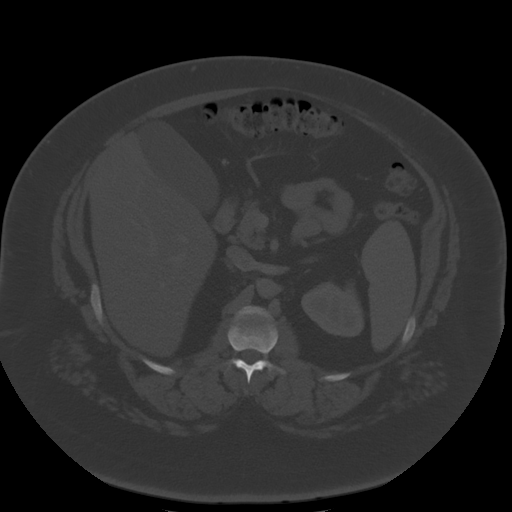
[im 67/93  soft-tissue]
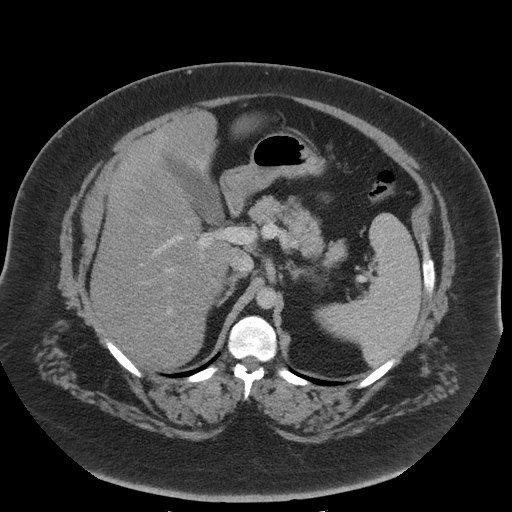
[im 72/93  soft-tissue]
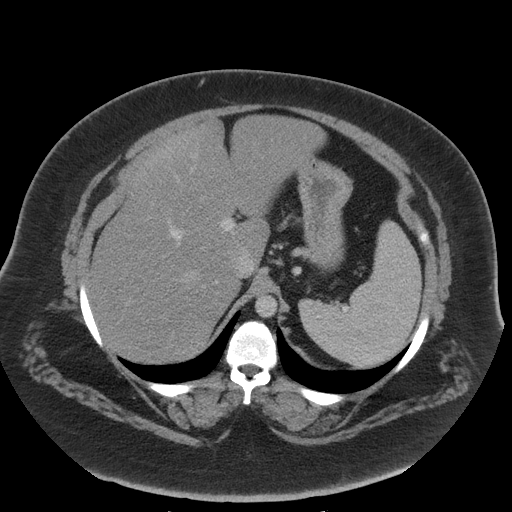
[im 82/93  soft-tissue]
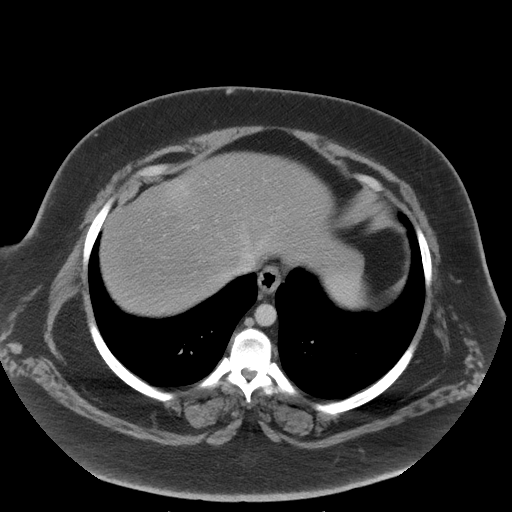
[im 87/93  soft-tissue]
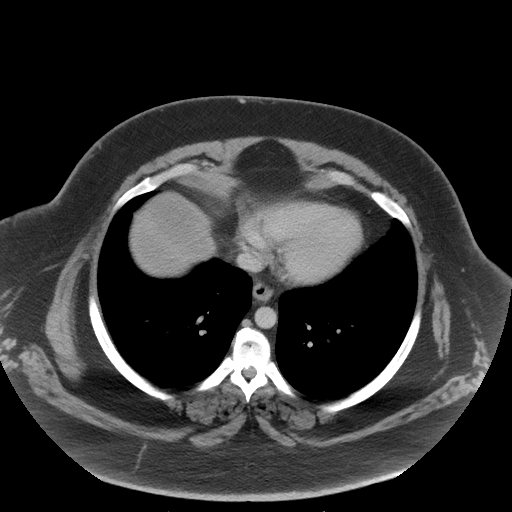

[coronal · coronal · 0.92mm/px · 3 of 171 slices shown]
[im 57/171  soft-tissue]
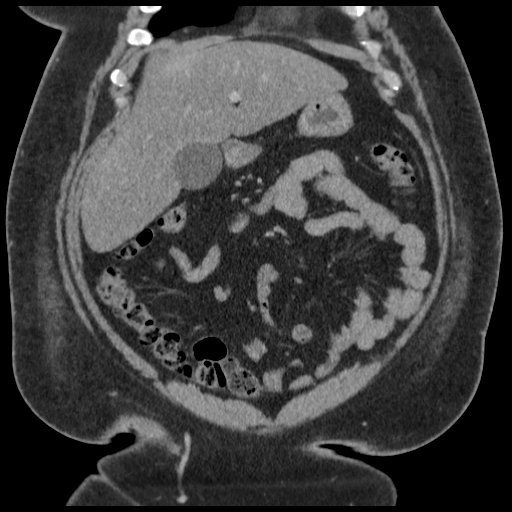
[im 76/171  soft-tissue]
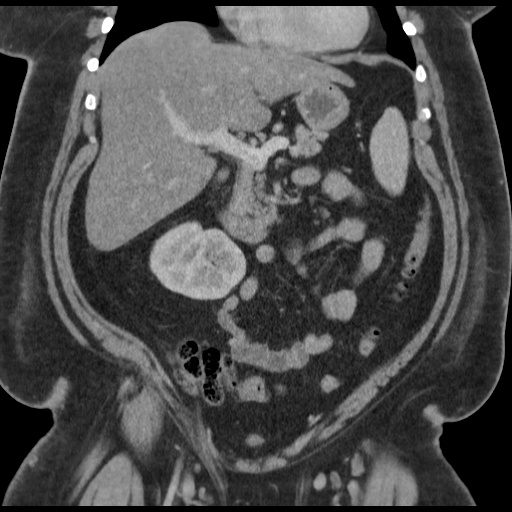
[im 95/171  soft-tissue]
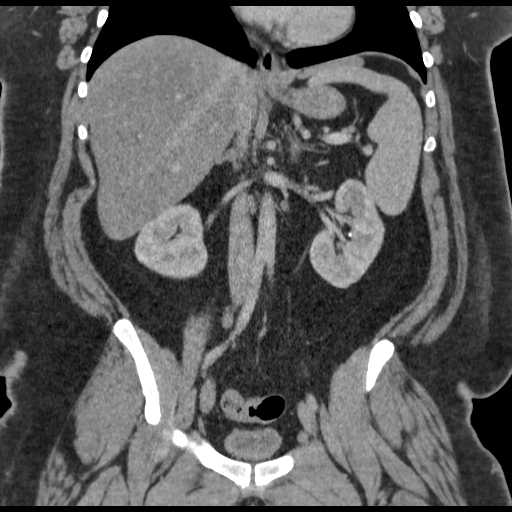

[16 of 46 positions shown; findings below may reference images not displayed]

FINDINGS: The lung bases are clear. There is a small hiatal hernia.

The liver is enlarged measuring 22 cm in length. There is diffuse
fatty change in the liver. No focal liver lesions are identified.
There is no biliary duct dilatation. There is no evidence of liver
laceration or rupture. There is no perihepatic fluid.

Spleen appears intact without laceration or rupture. There is no
perisplenic fluid. Spleen appears normal.

Pancreas and adrenals appear normal.

Right kidney is mildly malrotated, stable. There are two, 2 mm
calculi in the mid left kidney with a 1 mm nearby calculus as well.
There is no mass or hydronephrosis in either kidney. There is no
contrast extravasation or perinephric fluid on either side.

There is a a rather minimal ventral hernia which contains only fat.
There is some mild mesenteric panniculitis in the area of this small
hernia.

In the pelvis, the urinary bladder is partially decompressed. There
is no pelvic mass or fluid collection. Appendix appears normal.

There is no bowel obstruction. No free air or portal venous air.
There is no ascites, adenopathy, or abscess in the abdomen or
pelvis. There is mild edema in the fat of the anterior abdominal
wall on both right and left sides. Is no appreciable bowel wall or
mesenteric thickening.

The aorta is nonaneurysmal. There are no blastic or lytic bone
lesions. No fractures are appreciated.
IMPRESSION: There is a small ventral hernia. There is localized mesenteric
panniculitis in this area. There is also some mild edema in the
right and left abdominal wall regions. These findings may well have
posttraumatic etiology.

There is no ascites or fluid collection in the abdomen or pelvis. No
bowel wall thickening. No bowel obstruction.

Liver is enlarged with fatty change.

There are nonobstructing calculi in the left kidney. Right kidney is
mildly malrotated but otherwise appears normal.

There is a small hiatal hernia.

## 2016-01-07 ENCOUNTER — Emergency Department (HOSPITAL_COMMUNITY): Payer: Self-pay

## 2016-01-07 ENCOUNTER — Emergency Department (HOSPITAL_COMMUNITY)
Admission: EM | Admit: 2016-01-07 | Discharge: 2016-01-07 | Disposition: A | Discharge status: Home / Self Care | Payer: Self-pay | Attending: Emergency Medicine | Admitting: Emergency Medicine

## 2016-01-07 DIAGNOSIS — Y929 Unspecified place or not applicable: Secondary | ICD-10-CM | POA: Insufficient documentation

## 2016-01-07 DIAGNOSIS — Y939 Activity, unspecified: Secondary | ICD-10-CM | POA: Insufficient documentation

## 2016-01-07 DIAGNOSIS — R079 Chest pain, unspecified: Secondary | ICD-10-CM | POA: Insufficient documentation

## 2016-01-07 DIAGNOSIS — F17210 Nicotine dependence, cigarettes, uncomplicated: Secondary | ICD-10-CM | POA: Insufficient documentation

## 2016-01-07 DIAGNOSIS — Z791 Long term (current) use of non-steroidal anti-inflammatories (NSAID): Secondary | ICD-10-CM | POA: Insufficient documentation

## 2016-01-07 DIAGNOSIS — Z79899 Other long term (current) drug therapy: Secondary | ICD-10-CM | POA: Insufficient documentation

## 2016-01-07 DIAGNOSIS — X500XXA Overexertion from strenuous movement or load, initial encounter: Secondary | ICD-10-CM | POA: Insufficient documentation

## 2016-01-07 DIAGNOSIS — Y990 Civilian activity done for income or pay: Secondary | ICD-10-CM | POA: Insufficient documentation

## 2016-01-07 DIAGNOSIS — R1013 Epigastric pain: Secondary | ICD-10-CM | POA: Insufficient documentation

## 2016-01-07 LAB — BASIC METABOLIC PANEL
ANION GAP: 6 (ref 5–15)
BUN: 10 mg/dL (ref 6–20)
CO2: 27 mmol/L (ref 22–32)
Calcium: 8.9 mg/dL (ref 8.9–10.3)
Chloride: 106 mmol/L (ref 101–111)
Creatinine, Ser: 0.74 mg/dL (ref 0.61–1.24)
GFR calc Af Amer: >60 mL/min (ref >60)
GFR calc non Af Amer: >60 mL/min (ref >60)
Glucose, Bld: 99 mg/dL (ref 65–99)
POTASSIUM: 4 mmol/L (ref 3.5–5.1)
SODIUM: 139 mmol/L (ref 135–145)

## 2016-01-07 LAB — CBC
HEMATOCRIT: 40.4 % (ref 39.0–52.0)
HEMOGLOBIN: 13.2 g/dL (ref 13.0–17.0)
MCH: 26.7 pg (ref 26.0–34.0)
MCHC: 32.7 g/dL (ref 30.0–36.0)
MCV: 81.8 fL (ref 78.0–100.0)
Platelets: 271 10*3/uL (ref 150–400)
RBC: 4.94 MIL/uL (ref 4.22–5.81)
RDW: 14.9 % (ref 11.5–15.5)
WBC: 9.2 10*3/uL (ref 4.0–10.5)

## 2016-01-07 LAB — I-STAT TROPONIN, ED: Troponin i, poc: 0 ng/mL (ref 0.00–0.08)

## 2016-01-07 MED ORDER — OMEPRAZOLE 20 MG PO CPDR
20.0000 mg | DELAYED_RELEASE_CAPSULE | Freq: Every day | ORAL | Status: DC
Start: 2016-01-07 — End: 2022-08-26

## 2016-01-07 NOTE — ED Notes (Signed)
MD at bedside. 

## 2016-01-07 NOTE — ED Provider Notes (Signed)
CSN: [id number]     Arrival date & time 01/07/16  1037 History   First MD Initiated Contact with Patient 01/07/16 1208     Chief Complaint  Patient presents with  . Chest Pain     HPI Patient presents emergency department with several episodes of intermittent transient chest and epigastric pain when lifting boxes yesterday.  He does smoke cigarettes and is morbidly obese.  He has a history of hypertension but denies a history of hyperlipidemia.  No family history of early cardiac disease.  Denies unilateral leg swelling.  No shortness of breath.  No active chest pain at this time.  He states he also has obsessive-compulsive disorder and is somewhat of a hypochondriac per the patient.  He states he wanted his chest pain evaluated to help ease his mind.   Past Medical History  Diagnosis Date  . Kidney stone    Past Surgical History  Procedure Laterality Date  . Pilonidal cyst drainage    . Ankle surgery     Family History  Problem Relation Age of Onset  . Cancer Other   . Hypertension Other    Social History  Substance Use Topics  . Smoking status: Current Every Day Smoker -- 1.00 packs/day    Types: Cigarettes  . Smokeless tobacco: Not on file  . Alcohol Use: Yes    Review of Systems  All other systems reviewed and are negative.     Allergies  Review of patient's allergies indicates no known allergies.  Home Medications   Prior to Admission medications   Medication Sig Start Date End Date Taking? Authorizing Provider  acetaminophen (TYLENOL) 500 MG tablet Take 1,000 mg by mouth every 6 (six) hours as needed for moderate pain or headache.   Yes Historical Provider, MD  ibuprofen (ADVIL,MOTRIN) 200 MG tablet Take 400 mg by mouth every 6 (six) hours as needed for pain or headache.   Yes Historical Provider, MD  lisinopril (PRINIVIL,ZESTRIL) 20 MG tablet Take 40 mg by mouth daily. 11/01/15 10/31/16 Yes Historical Provider, MD  sertraline (ZOLOFT) 100 MG tablet Take 200 mg  by mouth daily. 12/09/15 12/08/16 Yes Historical Provider, MD  busPIRone (BUSPAR) 15 MG tablet Take 15 mg by mouth 2 (two) times daily as needed. Reported on 01/07/2016 12/09/15 12/08/16  Historical Provider, MD  omeprazole (PRILOSEC) 20 MG capsule Take 1 capsule (20 mg total) by mouth daily. 01/07/16   Azalia Bilis, MD   BP 148/75 mmHg  Pulse 109  Resp 22  SpO2 100% Physical Exam  Constitutional: He is oriented to person, place, and time. He appears well-developed and well-nourished.  HENT:  Head: Normocephalic and atraumatic.  Eyes: EOM are normal.  Neck: Normal range of motion.  Cardiovascular: Normal rate, regular rhythm, normal heart sounds and intact distal pulses.   Pulmonary/Chest: Effort normal and breath sounds normal. No respiratory distress.  Abdominal: Soft. He exhibits no distension. There is no tenderness.  Musculoskeletal: Normal range of motion.  Neurological: He is alert and oriented to person, place, and time.  Skin: Skin is warm and dry.  Psychiatric: He has a normal mood and affect. Judgment normal.  Nursing note and vitals reviewed.   ED Course  Procedures (including critical care time) Labs Review Labs Reviewed  BASIC METABOLIC PANEL  CBC  I-STAT TROPOININ, ED    Imaging Review Dg Chest 2 View  01/07/2016  CLINICAL DATA:  Chest pain. Epigastric pain for 1 day. Tobacco use. EXAM: CHEST  2 VIEW COMPARISON:  05/23/2005 FINDINGS: The cardiomediastinal silhouette is within normal limits. The lungs are well inflated and clear. There is no evidence of pleural effusion or pneumothorax. No acute osseous abnormality is identified. IMPRESSION: No active cardiopulmonary disease. Electronically Signed   By: Sebastian AcheAllen  Grady M.D.   On: 01/07/2016 11:34   I have personally reviewed and evaluated these images and lab results as part of my medical decision-making.   EKG Interpretation   Date/Time:  Tuesday Jan 07 2016 11:02:44 EDT Ventricular Rate:  94 PR Interval:  139 QRS  Duration: 94 QT Interval:  356 QTC Calculation: 445 R Axis:   35 Text Interpretation:  Sinus rhythm No significant change was found  Confirmed by Samba Cumba  MD, Terease Marcotte ([id number]) on 01/07/2016 12:23:19 PM      MDM   Final diagnoses:  Chest pain, unspecified chest pain type    Atypical chest pain.  EKG without ischemic changes.  Troponin normal.  Doubt PE.  May represent gastroesophageal reflux disease and will place on daily PPI.  I long discussion with patient regarding lifestyle modification given his weight and his hypertension and his tobacco abuse.  He understands return to the ER for new or worsening symptoms.   Azalia BilisKevin Yojan Paskett, MD 01/08/16 1012

## 2016-01-07 NOTE — Discharge Instructions (Signed)
Nonspecific Chest Pain  °Chest pain can be caused by many different conditions. There is always a chance that your pain could be related to something serious, such as a heart attack or a blood clot in your lungs. Chest pain can also be caused by conditions that are not life-threatening. If you have chest pain, it is very important to follow up with your health care provider. °CAUSES  °Chest pain can be caused by: °· Heartburn. °· Pneumonia or bronchitis. °· Anxiety or stress. °· Inflammation around your heart (pericarditis) or lung (pleuritis or pleurisy). °· A blood clot in your lung. °· A collapsed lung (pneumothorax). It can develop suddenly on its own (spontaneous pneumothorax) or from trauma to the chest. °· Shingles infection (varicella-zoster virus). °· Heart attack. °· Damage to the bones, muscles, and cartilage that make up your chest wall. This can include: °¨ Bruised bones due to injury. °¨ Strained muscles or cartilage due to frequent or repeated coughing or overwork. °¨ Fracture to one or more ribs. °¨ Sore cartilage due to inflammation (costochondritis). °RISK FACTORS  °Risk factors for chest pain may include: °· Activities that increase your risk for trauma or injury to your chest. °· Respiratory infections or conditions that cause frequent coughing. °· Medical conditions or overeating that can cause heartburn. °· Heart disease or family history of heart disease. °· Conditions or health behaviors that increase your risk of developing a blood clot. °· Having had chicken pox (varicella zoster). °SIGNS AND SYMPTOMS °Chest pain can feel like: °· Burning or tingling on the surface of your chest or deep in your chest. °· Crushing, pressure, aching, or squeezing pain. °· Dull or sharp pain that is worse when you move, cough, or take a deep breath. °· Pain that is also felt in your back, neck, shoulder, or arm, or pain that spreads to any of these areas. °Your chest pain may come and go, or it may stay  constant. °DIAGNOSIS °Lab tests or other studies may be needed to find the cause of your pain. Your health care provider may have you take a test called an ambulatory ECG (electrocardiogram). An ECG records your heartbeat patterns at the time the test is performed. You may also have other tests, such as: °· Transthoracic echocardiogram (TTE). During echocardiography, sound waves are used to create a picture of all of the heart structures and to look at how blood flows through your heart. °· Transesophageal echocardiogram (TEE). This is a more advanced imaging test that obtains images from inside your body. It allows your health care provider to see your heart in finer detail. °· Cardiac monitoring. This allows your health care provider to monitor your heart rate and rhythm in real time. °· Holter monitor. This is a portable device that records your heartbeat and can help to diagnose abnormal heartbeats. It allows your health care provider to track your heart activity for several days, if needed. °· Stress tests. These can be done through exercise or by taking medicine that makes your heart beat more quickly. °· Blood tests. °· Imaging tests. °TREATMENT  °Your treatment depends on what is causing your chest pain. Treatment may include: °· Medicines. These may include: °¨ Acid blockers for heartburn. °¨ Anti-inflammatory medicine. °¨ Pain medicine for inflammatory conditions. °¨ Antibiotic medicine, if an infection is present. °¨ Medicines to dissolve blood clots. °¨ Medicines to treat coronary artery disease. °· Supportive care for conditions that do not require medicines. This may include: °¨ Resting. °¨ Applying heat   or cold packs to injured areas. °¨ Limiting activities until pain decreases. °HOME CARE INSTRUCTIONS °· If you were prescribed an antibiotic medicine, finish it all even if you start to feel better. °· Avoid any activities that bring on chest pain. °· Do not use any tobacco products, including  cigarettes, chewing tobacco, or electronic cigarettes. If you need help quitting, ask your health care provider. °· Do not drink alcohol. °· Take medicines only as directed by your health care provider. °· Keep all follow-up visits as directed by your health care provider. This is important. This includes any further testing if your chest pain does not go away. °· If heartburn is the cause for your chest pain, you may be told to keep your head raised (elevated) while sleeping. This reduces the chance that acid will go from your stomach into your esophagus. °· Make lifestyle changes as directed by your health care provider. These may include: °¨ Getting regular exercise. Ask your health care provider to suggest some activities that are safe for you. °¨ Eating a heart-healthy diet. A registered dietitian can help you to learn healthy eating options. °¨ Maintaining a healthy weight. °¨ Managing diabetes, if necessary. °¨ Reducing stress. °SEEK MEDICAL CARE IF: °· Your chest pain does not go away after treatment. °· You have a rash with blisters on your chest. °· You have a fever. °SEEK IMMEDIATE MEDICAL CARE IF:  °· Your chest pain is worse. °· You have an increasing cough, or you cough up blood. °· You have severe abdominal pain. °· You have severe weakness. °· You faint. °· You have chills. °· You have sudden, unexplained chest discomfort. °· You have sudden, unexplained discomfort in your arms, back, neck, or jaw. °· You have shortness of breath at any time. °· You suddenly start to sweat, or your skin gets clammy. °· You feel nauseous or you vomit. °· You suddenly feel light-headed or dizzy. °· Your heart begins to beat quickly, or it feels like it is skipping beats. °These symptoms may represent a serious problem that is an emergency. Do not wait to see if the symptoms will go away. Get medical help right away. Call your local emergency services (911 in the [place] Do not drive yourself to the hospital. °  °This  information is not intended to replace advice given to you by your health care provider. Make sure you discuss any questions you have with your health care provider. °  °Document Released: 05/13/2005 Document Revised: 08/24/2014 Document Reviewed: 03/09/2014 °[employer] Interactive Patient Education ©2016 [employer] ° °

## 2016-01-07 NOTE — ED Notes (Signed)
Pt reports epigastric pain which started yesterday while lifting heavy boxes at work with lightheadedness. Pt reports pain is non-radiating and intermittent.  Pt reports pain is worse with inhalation.  Denies any cp at this time.

## 2017-01-21 ENCOUNTER — Encounter (HOSPITAL_COMMUNITY): Payer: Self-pay | Admitting: Emergency Medicine

## 2017-01-21 ENCOUNTER — Emergency Department (HOSPITAL_COMMUNITY)
Admission: EM | Admit: 2017-01-21 | Discharge: 2017-01-21 | Disposition: A | Discharge status: Home / Self Care | Payer: Self-pay | Attending: Emergency Medicine | Admitting: Emergency Medicine

## 2017-01-21 DIAGNOSIS — F17210 Nicotine dependence, cigarettes, uncomplicated: Secondary | ICD-10-CM | POA: Insufficient documentation

## 2017-01-21 DIAGNOSIS — J039 Acute tonsillitis, unspecified: Secondary | ICD-10-CM | POA: Insufficient documentation

## 2017-01-21 DIAGNOSIS — Z79899 Other long term (current) drug therapy: Secondary | ICD-10-CM | POA: Insufficient documentation

## 2017-01-21 LAB — MONONUCLEOSIS SCREEN: Mono Screen: NEGATIVE

## 2017-01-21 LAB — CBC WITH DIFFERENTIAL/PLATELET
Basophils Absolute: 0 10*3/uL (ref 0.0–0.1)
Basophils Relative: 0 %
Eosinophils Absolute: 0 10*3/uL (ref 0.0–0.7)
Eosinophils Relative: 0 %
HEMATOCRIT: 40.3 % (ref 39.0–52.0)
HEMOGLOBIN: 13.2 g/dL (ref 13.0–17.0)
LYMPHS ABS: 1.6 10*3/uL (ref 0.7–4.0)
Lymphocytes Relative: 11 %
MCH: 26.1 pg (ref 26.0–34.0)
MCHC: 32.8 g/dL (ref 30.0–36.0)
MCV: 79.8 fL (ref 78.0–100.0)
Monocytes Absolute: 1.3 10*3/uL — ABNORMAL HIGH (ref 0.1–1.0)
Monocytes Relative: 9 %
Neutro Abs: 11.2 10*3/uL — ABNORMAL HIGH (ref 1.7–7.7)
Neutrophils Relative %: 80 %
PLATELETS: 214 10*3/uL (ref 150–400)
RBC: 5.05 MIL/uL (ref 4.22–5.81)
RDW: 15.6 % — ABNORMAL HIGH (ref 11.5–15.5)
WBC: 14.1 10*3/uL — ABNORMAL HIGH (ref 4.0–10.5)

## 2017-01-21 LAB — BASIC METABOLIC PANEL
ANION GAP: 10 (ref 5–15)
BUN: 10 mg/dL (ref 6–20)
CHLORIDE: 101 mmol/L (ref 101–111)
CO2: 25 mmol/L (ref 22–32)
Calcium: 9 mg/dL (ref 8.9–10.3)
Creatinine, Ser: 0.98 mg/dL (ref 0.61–1.24)
GFR calc Af Amer: >60 mL/min (ref >60)
GFR calc non Af Amer: >60 mL/min (ref >60)
Glucose, Bld: 104 mg/dL — ABNORMAL HIGH (ref 65–99)
Potassium: 3.2 mmol/L — ABNORMAL LOW (ref 3.5–5.1)
Sodium: 136 mmol/L (ref 135–145)

## 2017-01-21 LAB — RAPID STREP SCREEN (MED CTR MEBANE ONLY): Streptococcus, Group A Screen (Direct): NEGATIVE

## 2017-01-21 MED ORDER — AMOXICILLIN 500 MG PO CAPS
500.0000 mg | ORAL_CAPSULE | Freq: Once | ORAL | Status: AC
  Administered 2017-01-21: 500 mg via ORAL
  Filled 2017-01-21: qty 1

## 2017-01-21 MED ORDER — DEXAMETHASONE 4 MG PO TABS
10.0000 mg | ORAL_TABLET | Freq: Once | ORAL | Status: AC
  Administered 2017-01-21: 10 mg via ORAL
  Filled 2017-01-21: qty 3

## 2017-01-21 MED ORDER — NAPROXEN 500 MG PO TABS: 500.0000 mg | ORAL_TABLET | Freq: Two times a day (BID) | ORAL | 0 refills | Status: DC

## 2017-01-21 MED ORDER — TRAMADOL HCL 50 MG PO TABS: 50.0000 mg | ORAL_TABLET | Freq: Four times a day (QID) | ORAL | 0 refills | Status: DC | PRN

## 2017-01-21 MED ORDER — OXYCODONE-ACETAMINOPHEN 5-325 MG PO TABS
1.0000 | ORAL_TABLET | Freq: Once | ORAL | Status: AC
  Administered 2017-01-21: 1 via ORAL
  Filled 2017-01-21: qty 1

## 2017-01-21 MED ORDER — AMOXICILLIN 500 MG PO CAPS: 500.0000 mg | ORAL_CAPSULE | Freq: Three times a day (TID) | ORAL | 0 refills | Status: DC

## 2017-01-21 NOTE — Discharge Instructions (Signed)
Take tylenol as needed for fever, return immediately if you have difficulty swallowing, fever that does not come down with tylenol, persistent vomiting, stiff neck or other problems.

## 2017-01-21 NOTE — ED Notes (Signed)
See EDP secondary assessment.  

## 2017-01-21 NOTE — ED Provider Notes (Signed)
[facility]-EMERGENCY DEPT Provider Note   CSN: [id number] Arrival date & time: 01/21/17  1843  By signing my name below, I, Linna Darner, attest that this documentation has been prepared under the direction and in the presence of [facility] M. Damian Leavell, NP. Electronically Signed: Linna Darner, Scribe. 01/21/2017. 7:57 PM.  History   Chief Complaint Chief Complaint  Patient presents with  . Sore Throat   The history is provided by the patient. No language interpreter was used.  Sore Throat  This is a new problem. The current episode started yesterday. The problem occurs constantly. The problem has been gradually worsening. Associated symptoms include headaches. Pertinent negatives include no chest pain and no abdominal pain. The symptoms are aggravated by swallowing. Nothing relieves the symptoms. He has tried acetaminophen for the symptoms. The treatment provided no relief.   HPI Comments: Andrew Gibson is a 31 y.o. male who presents to the Emergency Department complaining of a constant, gradually worsening sore throat beginning yesterday. He reports associated fevers (tmax 102), nasal congestion, a cough productive of clear sputum, bilateral ear pain, headaches, and cervical lymphadenopathy. Patient endorses exacerbation of his sore throat with swallowing and does report some difficulty swallowing solids but is able to tolerate liquids. He has been taking Dayquil and Zicam without significant improvement of his symptoms; he last took Dayquil about 4 hours ago. No other medications or treatments tried. No known contacts with similar symptoms. He denies nausea, vomiting, diarrhea, abdominal pain, chest pain, back pain, or any other associated symptoms.  Past Medical History:  Diagnosis Date  . Kidney stone     There are no active problems to display for this patient.   Past Surgical History:  Procedure Laterality Date  . ANKLE SURGERY    . HERNIA REPAIR    . PILONIDAL CYST DRAINAGE    . WISDOM  TOOTH EXTRACTION         Home Medications    Prior to Admission medications   Medication Sig Start Date End Date Taking? Authorizing Provider  acetaminophen (TYLENOL) 500 MG tablet Take 1,000 mg by mouth every 6 (six) hours as needed for moderate pain or headache.    [provider]  amoxicillin (AMOXIL) 500 MG capsule Take 1 capsule (500 mg total) by mouth 3 (three) times daily. 01/21/17   Janne Napoleon, NP  lisinopril (PRINIVIL,ZESTRIL) 20 MG tablet Take 40 mg by mouth daily. 11/01/15 10/31/16  [provider]  naproxen (NAPROSYN) 500 MG tablet Take 1 tablet (500 mg total) by mouth 2 (two) times daily. 01/21/17   Janne Napoleon, NP  omeprazole (PRILOSEC) 20 MG capsule Take 1 capsule (20 mg total) by mouth daily. 01/07/16   Azalia Bilis, MD  sertraline (ZOLOFT) 100 MG tablet Take 200 mg by mouth daily. 12/09/15 12/08/16  [provider]  traMADol (ULTRAM) 50 MG tablet Take 1 tablet (50 mg total) by mouth every 6 (six) hours as needed. 01/21/17   Janne Napoleon, NP    Family History Family History  Problem Relation Age of Onset  . Cancer Other   . Hypertension Other     Social History Social History  Substance Use Topics  . Smoking status: Current Every Day Smoker    Packs/day: 1.00    Types: Cigarettes  . Smokeless tobacco: Never Used  . Alcohol use Yes     Allergies   Patient has no known allergies.   Review of Systems Review of Systems  Constitutional: Positive for fever.  HENT:  Positive for congestion, ear pain, sore throat and trouble swallowing.   Respiratory: Negative for cough.   Cardiovascular: Negative for chest pain.  Gastrointestinal: Negative for abdominal pain, diarrhea, nausea and vomiting.  Genitourinary: Negative for dysuria.  Musculoskeletal: Negative for back pain.  Skin: Negative for rash.  Neurological: Positive for headaches.  Hematological: Positive for adenopathy.  Psychiatric/Behavioral: Negative for confusion.  All other  systems reviewed and are negative.  Physical Exam Updated Vital Signs BP (!) 141/80 (BP Location: Right Arm)   Pulse (!) 110   Temp 100 F (37.8 C) (Oral)   Resp 18   Ht 5' 7.5" (1.715 m)   Wt (!) 158.8 kg (350 lb)   SpO2 97%   BMI 54.01 kg/m   Physical Exam  Constitutional: He is oriented to person, place, and time. He appears well-developed and well-nourished. No distress.  HENT:  Head: Normocephalic and atraumatic.  Right Ear: Tympanic membrane normal.  Left Ear: Tympanic membrane normal.  Nose: Mucosal edema present.  Mouth/Throat: Uvula is midline and mucous membranes are normal. Oropharyngeal exudate and posterior oropharyngeal erythema present. No tonsillar abscesses. Tonsils are 2+ on the right. Tonsillar exudate.  Area of pus to right tonsil.  Eyes: Conjunctivae and EOM are normal.  Neck: Neck supple. No tracheal deviation present.  Cardiovascular: Tachycardia present.   Pulmonary/Chest: Effort normal and breath sounds normal.  Abdominal: Soft. Bowel sounds are normal. There is no tenderness.  Musculoskeletal: Normal range of motion.  Neurological: He is alert and oriented to person, place, and time.  Skin: Skin is warm and dry.  Psychiatric: He has a normal mood and affect. His behavior is normal.  Nursing note and vitals reviewed.  ED Treatments / Results  Labs (all labs ordered are listed, but only abnormal results are displayed) Labs Reviewed  CBC WITH DIFFERENTIAL/PLATELET - Abnormal; Notable for the following:       Result Value   WBC 14.1 (*)    RDW 15.6 (*)    Neutro Abs 11.2 (*)    Monocytes Absolute 1.3 (*)    All other components within normal limits  BASIC METABOLIC PANEL - Abnormal; Notable for the following:    Potassium 3.2 (*)    Glucose, Bld 104 (*)    All other components within normal limits  RAPID STREP SCREEN (NOT AT [facility])  CULTURE, GROUP A STREP [facility])  MONONUCLEOSIS SCREEN   Radiology No results found.  Procedures Procedures  (including critical care time)  DIAGNOSTIC STUDIES: Oxygen Saturation is 97% on RA, normal by my interpretation.    COORDINATION OF CARE: 7:55 PM Discussed treatment plan with pt at bedside and pt agreed to plan.  Medications Ordered in ED Medications  dexamethasone (DECADRON) tablet 10 mg (not administered)  amoxicillin (AMOXIL) capsule 500 mg (not administered)  oxyCODONE-acetaminophen (PERCOCET/ROXICET) 5-325 MG per tablet 1 tablet (1 tablet Oral Given 01/21/17 2152)     Initial Impression / Assessment and Plan / ED Course  I have reviewed the triage vital signs and the nursing notes.  Final Clinical Impressions(s) / ED Diagnoses  31 y.o. male with fever, sore throat, gland swelling stable for d/c without tonsillar abscess. Swallowing liquids without difficulty, no change in voice. Will treat for tonsillitis and he will return for worsening symptoms. Discussed reasons for return. Rx Amoxicillin, Naprosyn and Tramadol. Decadron 10 mg. PO given prior to d/c.  Final diagnoses:  Tonsillitis    New Prescriptions New Prescriptions   AMOXICILLIN (AMOXIL) 500 MG CAPSULE  Take 1 capsule (500 mg total) by mouth 3 (three) times daily.   NAPROXEN (NAPROSYN) 500 MG TABLET    Take 1 tablet (500 mg total) by mouth 2 (two) times daily.   TRAMADOL (ULTRAM) 50 MG TABLET    Take 1 tablet (50 mg total) by mouth every 6 (six) hours as needed.   I personally performed the services described in this documentation, which was scribed in my presence. The recorded information has been reviewed and is accurate.    Kerrie Buffaloeese, Hope PortageM, [place]NP 01/21/17 2225    Shaune PollackIsaacs, [place], MD 01/22/17 [phone]

## 2017-01-21 NOTE — ED Triage Notes (Signed)
Pt  C/o sore throat onset yesterday.  St's he can't eat due to pain.  Pt took Dayquil PTA

## 2017-01-21 NOTE — ED Notes (Signed)
This tech attempted to draw blood from pt and was unsuccessful. Asked Misty EMT to attempt

## 2017-01-22 LAB — CULTURE, GROUP A STREP (THRC)

## 2018-09-12 DIAGNOSIS — F329 Major depressive disorder, single episode, unspecified: Secondary | ICD-10-CM | POA: Insufficient documentation

## 2019-09-22 DIAGNOSIS — F429 Obsessive-compulsive disorder, unspecified: Secondary | ICD-10-CM | POA: Insufficient documentation

## 2020-07-19 ENCOUNTER — Emergency Department (HOSPITAL_COMMUNITY)
Admission: EM | Admit: 2020-07-19 | Discharge: 2020-07-19 | Disposition: A | Discharge status: Home / Self Care | Payer: No Typology Code available for payment source | Attending: Emergency Medicine | Admitting: Emergency Medicine

## 2020-07-19 ENCOUNTER — Encounter (HOSPITAL_COMMUNITY): Payer: Self-pay

## 2020-07-19 DIAGNOSIS — E119 Type 2 diabetes mellitus without complications: Secondary | ICD-10-CM | POA: Diagnosis not present

## 2020-07-19 DIAGNOSIS — Z79899 Other long term (current) drug therapy: Secondary | ICD-10-CM | POA: Diagnosis not present

## 2020-07-19 DIAGNOSIS — L0201 Cutaneous abscess of face: Secondary | ICD-10-CM | POA: Insufficient documentation

## 2020-07-19 DIAGNOSIS — I10 Essential (primary) hypertension: Secondary | ICD-10-CM | POA: Diagnosis not present

## 2020-07-19 DIAGNOSIS — F17210 Nicotine dependence, cigarettes, uncomplicated: Secondary | ICD-10-CM | POA: Insufficient documentation

## 2020-07-19 DIAGNOSIS — R220 Localized swelling, mass and lump, head: Secondary | ICD-10-CM | POA: Diagnosis present

## 2020-07-19 HISTORY — DX: Essential (primary) hypertension: I10

## 2020-07-19 HISTORY — DX: Hidradenitis suppurativa: L73.2

## 2020-07-19 HISTORY — DX: Hypochondriasis: F45.21

## 2020-07-19 HISTORY — DX: Obsessive-compulsive disorder, unspecified: F42.9

## 2020-07-19 HISTORY — DX: Type 2 diabetes mellitus without complications: E11.9

## 2020-07-19 MED ORDER — LIDOCAINE-EPINEPHRINE (PF) 2 %-1:200000 IJ SOLN
10.0000 mL | Freq: Once | INTRAMUSCULAR | Status: AC
  Administered 2020-07-19: 10 mL
  Filled 2020-07-19: qty 20

## 2020-07-19 MED ORDER — OXYCODONE-ACETAMINOPHEN 5-325 MG PO TABS
2.0000 | ORAL_TABLET | Freq: Once | ORAL | Status: AC
  Administered 2020-07-19: 2 via ORAL
  Filled 2020-07-19: qty 2

## 2020-07-19 MED ORDER — HYDROCODONE-ACETAMINOPHEN 5-325 MG PO TABS: 1.0000 | ORAL_TABLET | ORAL | 0 refills | Status: DC | PRN

## 2020-07-19 NOTE — ED Triage Notes (Signed)
Patient has an abscess to the right face that started 2 days ago. Patient was prescribed Doxycycline and has had 4 doses. Patient c/o pain when talking or moving his mouth.

## 2020-07-19 NOTE — Discharge Instructions (Addendum)
Have the facial abscess rechecked by your doctor in 2 days to insure improvement. If symptoms worsen, return to the emergency department for further management.   Continue taking Doxycycline for infection. Take Norco for pain as needed.

## 2020-07-19 NOTE — ED Provider Notes (Signed)
[facility]-EMERGENCY DEPT Provider Note   CSN: [id number] Arrival date & time: 07/19/20  1813     History Chief Complaint  Patient presents with  . Abscess    Andrew Gibson is a 34 y.o. male.  Patient with history of diabetes, hidradenitis, HTN, OCD presents with painful swelling to right cheek c/w multiple previous cutaneous abscesses. He reports the area drains intermittently. No fever. No dental caries or dental pain. His symptoms started 2 days ago and he started taking Doxycycline at that time. No nausea, vomiting, jaw pain, neck pain.  The history is provided by the patient. No language interpreter was used.  Abscess Associated symptoms: no fever, no headaches, no nausea and no vomiting        Past Medical History:  Diagnosis Date  . Diabetes mellitus without complication (HCC)   . Hydradenitis   . Hypertension   . Hypochondria   . Kidney stone   . OCD (obsessive compulsive disorder)     There are no problems to display for this patient.   Past Surgical History:  Procedure Laterality Date  . ANKLE SURGERY    . HERNIA REPAIR    . PILONIDAL CYST DRAINAGE    . WISDOM TOOTH EXTRACTION         Family History  Problem Relation Age of Onset  . Cancer Other   . Hypertension Other     Social History   Tobacco Use  . Smoking status: Current Every Day Smoker    Packs/day: 0.50    Types: Cigarettes  . Smokeless tobacco: Never Used  Vaping Use  . Vaping Use: Never used  Substance Use Topics  . Alcohol use: Not Currently  . Drug use: Yes    Types: Marijuana    Home Medications Prior to Admission medications   Medication Sig Start Date End Date Taking? Authorizing Provider  acetaminophen (TYLENOL) 500 MG tablet Take 1,000 mg by mouth every 6 (six) hours as needed for moderate pain or headache.    [provider]  amoxicillin (AMOXIL) 500 MG capsule Take 1 capsule (500 mg total) by mouth 3 (three) times daily. 01/21/17    Janne Napoleon, NP  lisinopril (PRINIVIL,ZESTRIL) 20 MG tablet Take 40 mg by mouth daily. 11/01/15 10/31/16  [provider]  naproxen (NAPROSYN) 500 MG tablet Take 1 tablet (500 mg total) by mouth 2 (two) times daily. 01/21/17   Janne Napoleon, NP  omeprazole (PRILOSEC) 20 MG capsule Take 1 capsule (20 mg total) by mouth daily. 01/07/16   Azalia Bilis, MD  sertraline (ZOLOFT) 100 MG tablet Take 200 mg by mouth daily. 12/09/15 12/08/16  [provider]  traMADol (ULTRAM) 50 MG tablet Take 1 tablet (50 mg total) by mouth every 6 (six) hours as needed. 01/21/17   Janne Napoleon, NP    Allergies    Other  Review of Systems   Review of Systems  Constitutional: Negative for chills and fever.  HENT: Positive for facial swelling. Negative for dental problem, ear pain and sore throat.   Eyes: Negative for pain.  Gastrointestinal: Negative for nausea and vomiting.  Musculoskeletal: Negative for myalgias and neck pain.  Skin: Positive for color change and wound.  Neurological: Negative for headaches.  Psychiatric/Behavioral: Negative for confusion.    Physical Exam Updated Vital Signs BP (!) 161/104   Pulse 87   Temp 98.8 F (37.1 C) (Oral)   Resp 15   Ht 5\' 7"  (1.702 m)  Wt (!) 152.4 kg   SpO2 100%   BMI 52.63 kg/m   Physical Exam Vitals and nursing note reviewed.  Constitutional:      Appearance: He is well-developed.  HENT:     Head: Atraumatic.     Comments: Right cheek erythematous. Area of induration extends to approximately 4 cm in diameter with minimal drainage centrally. No TMJ tenderness or swelling.     Nose: Nose normal.     Mouth/Throat:     Mouth: Mucous membranes are moist.     Comments: No visualized dental caries or apical abscesses. No dental tenderness.  Eyes:     Conjunctiva/sclera: Conjunctivae normal.     Comments: Full, painfree range of ocular motion.   Pulmonary:     Effort: Pulmonary effort is normal.  Musculoskeletal:        General:  Normal range of motion.     Cervical back: Normal range of motion.  Skin:    General: Skin is warm and dry.  Neurological:     Mental Status: He is alert and oriented to person, place, and time.     ED Results / Procedures / Treatments   Labs (all labs ordered are listed, but only abnormal results are displayed) Labs Reviewed - No data to display  EKG None  Radiology No results found.  Procedures .Marland KitchenIncision and Drainage  Date/Time: 07/19/2020 11:01 PM Performed by: Elpidio Anis, PA-C Authorized by: Elpidio Anis, PA-C   Consent:    Consent obtained:  Verbal   Consent given by:  Patient Location:    Type:  Abscess   Size:  4 cm   Location:  Head   Head location:  Face Pre-procedure details:    Skin preparation:  Betadine Anesthesia (see MAR for exact dosages):    Anesthesia method:  Local infiltration   Local anesthetic:  Lidocaine 2% WITH epi Procedure type:    Complexity:  Simple Procedure details:    Needle aspiration: yes     Needle size:  18 G   Incision types:  Stab incision   Scalpel blade:  11   Wound management:  Probed and deloculated   Drainage:  Bloody and purulent   Drainage amount:  Moderate   Packing materials:  None Post-procedure details:    Patient tolerance of procedure:  Tolerated well, no immediate complications   (including critical care time)  Medications Ordered in ED Medications  oxyCODONE-acetaminophen (PERCOCET/ROXICET) 5-325 MG per tablet 2 tablet (has no administration in time range)  lidocaine-EPINEPHrine (XYLOCAINE W/EPI) 2 %-1:200000 (PF) injection 10 mL (has no administration in time range)    ED Course  I have reviewed the triage vital signs and the nursing notes.  Pertinent labs & imaging results that were available during my care of the patient were reviewed by me and considered in my medical decision making (see chart for details).    MDM Rules/Calculators/A&P                          Patient to ED with  facial abscess requiring I&D as per procedure note above.   No fever. No deep space infection suspected. He is already on an appropriate antibiotic and is instructed to continue. Strongly encouraged 2-day recheck to insure improvement. Return precautions discussed.   Final Clinical Impression(s) / ED Diagnoses Final diagnoses:  None   1. Facial abscess  Rx / DC Orders ED Discharge Orders    None  Elpidio Anis, PA-C 07/19/20 2332    Tilden Fossa, MD 07/20/20 0120

## 2020-09-11 DIAGNOSIS — I10 Essential (primary) hypertension: Secondary | ICD-10-CM | POA: Insufficient documentation

## 2022-06-05 ENCOUNTER — Encounter: Payer: Self-pay | Admitting: Emergency Medicine

## 2022-06-05 ENCOUNTER — Ambulatory Visit
Admission: EM | Admit: 2022-06-05 | Discharge: 2022-06-05 | Disposition: A | Discharge status: Home / Self Care | Payer: No Typology Code available for payment source | Attending: Physician Assistant | Admitting: Physician Assistant

## 2022-06-05 DIAGNOSIS — J069 Acute upper respiratory infection, unspecified: Secondary | ICD-10-CM | POA: Insufficient documentation

## 2022-06-05 DIAGNOSIS — Z1152 Encounter for screening for COVID-19: Secondary | ICD-10-CM | POA: Insufficient documentation

## 2022-06-05 LAB — RESP PANEL BY RT-PCR (FLU A&B, COVID) ARPGX2
Influenza A by PCR: NEGATIVE
Influenza B by PCR: NEGATIVE
SARS Coronavirus 2 by RT PCR: NEGATIVE

## 2022-06-05 NOTE — ED Provider Notes (Signed)
[facility]    CSN: [id number] Arrival date & time: 06/05/22  1307      History   Chief Complaint Chief Complaint  Patient presents with   Cough   Ear Fullness   Fatigue    HPI Andrew Gibson is a 36 y.o. male.   Patient here today for evaluation of cough, sinus pressure and fatigue that started about 3 days ago.  He reports that he has also had some ear fullness.  He notes that his brother came home sick over the weekend and then just progressively worsened.  Patient reports his symptoms have worsened but did improve today somewhat.  He does think he has had low-grade fever.  He has tried over-the-counter DayQuil and NyQuil without significant improvement.  The history is provided by the patient.  Cough Associated symptoms: fever and sore throat   Associated symptoms: no chills, no ear pain, no eye discharge and no shortness of breath   Ear Fullness Pertinent negatives include no abdominal pain and no shortness of breath.    Past Medical History:  Diagnosis Date   Diabetes mellitus without complication (HCC)    Hydradenitis    Hypertension    Hypochondria    Kidney stone    OCD (obsessive compulsive disorder)     There are no problems to display for this patient.   Past Surgical History:  Procedure Laterality Date   ANKLE SURGERY     HERNIA REPAIR     PILONIDAL CYST DRAINAGE     WISDOM TOOTH EXTRACTION         Home Medications    Prior to Admission medications   Medication Sig Start Date End Date Taking? Authorizing Provider  acetaminophen (TYLENOL) 500 MG tablet Take 1,000 mg by mouth every 6 (six) hours as needed for moderate pain or headache.    [provider]  amoxicillin (AMOXIL) 500 MG capsule Take 1 capsule (500 mg total) by mouth 3 (three) times daily. 01/21/17   Janne Napoleon, NP  HYDROcodone-acetaminophen (NORCO/VICODIN) 5-325 MG tablet Take 1 tablet by mouth every 4 (four) hours as needed for severe pain. 07/19/20   Elpidio Anis, PA-C  lisinopril (PRINIVIL,ZESTRIL) 20 MG tablet Take 40 mg by mouth daily. 11/01/15 10/31/16  [provider]  naproxen (NAPROSYN) 500 MG tablet Take 1 tablet (500 mg total) by mouth 2 (two) times daily. 01/21/17   Janne Napoleon, NP  omeprazole (PRILOSEC) 20 MG capsule Take 1 capsule (20 mg total) by mouth daily. 01/07/16   Azalia Bilis, MD  sertraline (ZOLOFT) 100 MG tablet Take 200 mg by mouth daily. 12/09/15 12/08/16  [provider]  traMADol (ULTRAM) 50 MG tablet Take 1 tablet (50 mg total) by mouth every 6 (six) hours as needed. 01/21/17   Janne Napoleon, NP    Family History Family History  Problem Relation Age of Onset   Cancer Other    Hypertension Other     Social History Social History   Tobacco Use   Smoking status: Every Day    Packs/day: 0.50    Types: Cigarettes   Smokeless tobacco: Never  Vaping Use   Vaping Use: Never used  Substance Use Topics   Alcohol use: Not Currently   Drug use: Yes    Types: Marijuana     Allergies   Other   Review of Systems Review of Systems  Constitutional:  Positive for fever. Negative for chills.  HENT:  Positive for congestion and sore  throat. Negative for ear pain.   Eyes:  Negative for discharge and redness.  Respiratory:  Positive for cough. Negative for shortness of breath.   Gastrointestinal:  Negative for abdominal pain, nausea and vomiting.     Physical Exam Triage Vital Signs ED Triage Vitals  Enc Vitals Group     BP 06/05/22 1518 (!) 193/92     Pulse Rate 06/05/22 1517 94     Resp 06/05/22 1517 18     Temp 06/05/22 1517 98 F (36.7 C)     Temp src --      SpO2 06/05/22 1517 96 %     Weight --      Height --      Head Circumference --      Peak Flow --      Pain Score 06/05/22 1519 0     Pain Loc --      Pain Edu? --      Excl. in [place]? --    No data found.  Updated Vital Signs BP (!) 193/92   Pulse 94   Temp 98 F (36.7 C)   Resp 18   SpO2 96%   Physical Exam Vitals and  nursing note reviewed.  Constitutional:      General: He is not in acute distress.    Appearance: He is well-developed. He is not ill-appearing.  HENT:     Head: Normocephalic and atraumatic.     Nose: Congestion present.     Mouth/Throat:     Mouth: Mucous membranes are moist.     Pharynx: Posterior oropharyngeal erythema present. No oropharyngeal exudate.  Eyes:     Conjunctiva/sclera: Conjunctivae normal.  Cardiovascular:     Rate and Rhythm: Normal rate and regular rhythm.     Heart sounds: Normal heart sounds. No murmur heard. Pulmonary:     Effort: Pulmonary effort is normal. No respiratory distress.     Breath sounds: Normal breath sounds. No wheezing, rhonchi or rales.  Skin:    General: Skin is warm and dry.  Neurological:     Mental Status: He is alert.  Psychiatric:        Mood and Affect: Mood normal.        Behavior: Behavior normal.      UC Treatments / Results  Labs (all labs ordered are listed, but only abnormal results are displayed) Labs Reviewed  RESP PANEL BY RT-PCR (FLU A&B, COVID) [id number]    EKG   Radiology No results found.  Procedures Procedures (including critical care time)  Medications Ordered in UC Medications - No data to display  Initial Impression / Assessment and Plan / UC Course  I have reviewed the triage vital signs and the nursing notes.  Pertinent labs & imaging results that were available during my care of the patient were reviewed by me and considered in my medical decision making (see chart for details).    Suspect viral etiology of symptoms.  Will order COVID and flu screening.  Will await results for further recommendation.  Encouraged symptomatic treatment in the meantime.  Recommend follow-up with any further concerns.  Final Clinical Impressions(s) / UC Diagnoses   Final diagnoses:  Acute upper respiratory infection  Encounter for screening for COVID-19   Discharge Instructions   None    ED Prescriptions    None    PDMP not reviewed this encounter.   Francene Finders, PA-C 06/05/22 1539

## 2022-06-05 NOTE — ED Triage Notes (Signed)
Pt is present today with c/o cough, sinus pressure, fatigue, and ear fullness. Pt sx started Tuesday

## 2022-08-24 ENCOUNTER — Ambulatory Visit
Admission: EM | Admit: 2022-08-24 | Discharge: 2022-08-24 | Disposition: A | Discharge status: Home / Self Care | Payer: Managed Care, Other (non HMO) | Attending: Emergency Medicine | Admitting: Emergency Medicine

## 2022-08-24 DIAGNOSIS — Z79899 Other long term (current) drug therapy: Secondary | ICD-10-CM | POA: Insufficient documentation

## 2022-08-24 DIAGNOSIS — B349 Viral infection, unspecified: Secondary | ICD-10-CM | POA: Diagnosis present

## 2022-08-24 DIAGNOSIS — I10 Essential (primary) hypertension: Secondary | ICD-10-CM | POA: Diagnosis not present

## 2022-08-24 DIAGNOSIS — E119 Type 2 diabetes mellitus without complications: Secondary | ICD-10-CM | POA: Insufficient documentation

## 2022-08-24 DIAGNOSIS — Z7951 Long term (current) use of inhaled steroids: Secondary | ICD-10-CM | POA: Diagnosis not present

## 2022-08-24 DIAGNOSIS — Z7984 Long term (current) use of oral hypoglycemic drugs: Secondary | ICD-10-CM | POA: Diagnosis not present

## 2022-08-24 DIAGNOSIS — F429 Obsessive-compulsive disorder, unspecified: Secondary | ICD-10-CM | POA: Diagnosis not present

## 2022-08-24 DIAGNOSIS — R051 Acute cough: Secondary | ICD-10-CM | POA: Insufficient documentation

## 2022-08-24 DIAGNOSIS — H9202 Otalgia, left ear: Secondary | ICD-10-CM | POA: Diagnosis present

## 2022-08-24 DIAGNOSIS — F1290 Cannabis use, unspecified, uncomplicated: Secondary | ICD-10-CM | POA: Diagnosis not present

## 2022-08-24 DIAGNOSIS — F17210 Nicotine dependence, cigarettes, uncomplicated: Secondary | ICD-10-CM | POA: Diagnosis not present

## 2022-08-24 DIAGNOSIS — Z1152 Encounter for screening for COVID-19: Secondary | ICD-10-CM | POA: Diagnosis not present

## 2022-08-24 MED ORDER — LEVOCETIRIZINE DIHYDROCHLORIDE 5 MG PO TABS: 5.0000 mg | ORAL_TABLET | Freq: Every evening | ORAL | 0 refills | Status: DC

## 2022-08-24 MED ORDER — PREDNISONE 10 MG (21) PO TBPK: ORAL_TABLET | Freq: Every day | ORAL | 0 refills | Status: DC

## 2022-08-24 MED ORDER — ALBUTEROL SULFATE HFA 108 (90 BASE) MCG/ACT IN AERS: 1.0000 | INHALATION_SPRAY | Freq: Four times a day (QID) | RESPIRATORY_TRACT | 0 refills | Status: DC | PRN

## 2022-08-24 NOTE — ED Triage Notes (Signed)
Pt c/o cough, left ear pressure, night chills,   Onset ~ Wednesday   Reports covid exposure at work. States home test last wed was (-)   Denies pain in triage

## 2022-08-24 NOTE — Discharge Instructions (Addendum)
We will test for COVID patient is to check his MyChart for results Take over-the-counter cough and cold medications avoid anything with decongestants due to blood pressure is elevated.

## 2022-08-24 NOTE — ED Provider Notes (Signed)
[facility]    CSN: [id number] Arrival date & time: 08/24/22  0845      History   Chief Complaint Chief Complaint  Patient presents with   Cough    HPI Andrew Gibson is a 37 y.o. male.   Patient presents today with cough left ear pain congestion not feeling well since last Wednesday.  Patient states that he has had several coworkers positive for COVID.  Took a COVID test at home which was negative last week would like another one today.  Patient denies any shortness of breath chest pain or fever.  Does have some chills and just generalized not feeling well.  Has not been taking anything over-the-counter.  Patient does wear a CPAP at nighttime and states this does help some.    Past Medical History:  Diagnosis Date   Diabetes mellitus without complication (HCC)    Hydradenitis    Hypertension    Hypochondria    Kidney stone    OCD (obsessive compulsive disorder)     There are no problems to display for this patient.   Past Surgical History:  Procedure Laterality Date   ANKLE SURGERY     HERNIA REPAIR     PILONIDAL CYST DRAINAGE     WISDOM TOOTH EXTRACTION         Home Medications    Prior to Admission medications   Medication Sig Start Date End Date Taking? Authorizing Provider  albuterol (VENTOLIN HFA) 108 (90 Base) MCG/ACT inhaler Inhale 1-2 puffs into the lungs every 6 (six) hours as needed for wheezing or shortness of breath. 08/24/22  Yes Coralyn Mark, NP  atenolol (TENORMIN) 50 MG tablet Take 1 tablet by mouth daily. 11/22/21  Yes [provider]  levocetirizine (XYZAL) 5 MG tablet Take 1 tablet (5 mg total) by mouth every evening. 08/24/22  Yes Coralyn Mark, NP  lisinopril-hydrochlorothiazide (ZESTORETIC) 20-12.5 MG tablet Take 2 tablets by mouth daily. 11/22/21  Yes [provider]  metFORMIN (GLUCOPHAGE) 1000 MG tablet Take 1 tablet by mouth 2 (two) times daily. 03/25/21  Yes [provider]  predniSONE  (STERAPRED UNI-PAK 21 TAB) 10 MG (21) TBPK tablet Take by mouth daily. Take 6 tabs by mouth daily  for 2 days, then 5 tabs for 2 days, then 4 tabs for 2 days, then 3 tabs for 2 days, 2 tabs for 2 days, then 1 tab by mouth daily for 2 days 08/24/22  Yes Maple Mirza L, NP  sertraline (ZOLOFT) 100 MG tablet Take by mouth. 11/22/21  Yes [provider]  acetaminophen (TYLENOL) 500 MG tablet Take 1,000 mg by mouth every 6 (six) hours as needed for moderate pain or headache.    [provider]  amoxicillin (AMOXIL) 500 MG capsule Take 1 capsule (500 mg total) by mouth 3 (three) times daily. 01/21/17   Janne Napoleon, NP  HYDROcodone-acetaminophen (NORCO/VICODIN) 5-325 MG tablet Take 1 tablet by mouth every 4 (four) hours as needed for severe pain. 07/19/20   Elpidio Anis, PA-C  lisinopril (PRINIVIL,ZESTRIL) 20 MG tablet Take 40 mg by mouth daily. 11/01/15 10/31/16  [provider]  naproxen (NAPROSYN) 500 MG tablet Take 1 tablet (500 mg total) by mouth 2 (two) times daily. 01/21/17   Janne Napoleon, NP  omeprazole (PRILOSEC) 20 MG capsule Take 1 capsule (20 mg total) by mouth daily. 01/07/16   Azalia Bilis, MD  sertraline (ZOLOFT) 100 MG tablet Take 200 mg by mouth daily. 12/09/15 12/08/16  [provider]  traMADol (ULTRAM) 50 MG tablet Take 1 tablet (50 mg total) by mouth every 6 (six) hours as needed. 01/21/17   Ashley Murrain, NP    Family History Family History  Problem Relation Age of Onset   Cancer Other    Hypertension Other     Social History Social History   Tobacco Use   Smoking status: Every Day    Packs/day: 0.50    Types: Cigarettes   Smokeless tobacco: Never  Vaping Use   Vaping Use: Never used  Substance Use Topics   Alcohol use: Not Currently   Drug use: Yes    Types: Marijuana     Allergies   Other   Review of Systems Review of Systems  Constitutional:  Positive for chills and fatigue. Negative for fever.  HENT:  Positive for  congestion, ear pain, postnasal drip, rhinorrhea, sinus pressure and sinus pain. Negative for tinnitus.        Left ear pain  Eyes: Negative.   Respiratory:  Positive for cough. Negative for shortness of breath and wheezing.   Cardiovascular: Negative.   Gastrointestinal: Negative.   Genitourinary: Negative.   Neurological: Negative.      Physical Exam Triage Vital Signs ED Triage Vitals  Enc Vitals Group     BP 08/24/22 0918 (!) 176/120     Pulse Rate 08/24/22 0916 72     Resp 08/24/22 0916 18     Temp 08/24/22 0916 98.4 F (36.9 C)     Temp Source 08/24/22 0916 Oral     SpO2 08/24/22 0916 96 %     Weight --      Height --      Head Circumference --      Peak Flow --      Pain Score 08/24/22 0916 0     Pain Loc --      Pain Edu? --      Excl. in [place]? --    No data found.  Updated Vital Signs BP (!) 176/120 (BP Location: Left Arm)   Pulse 72   Temp 98.4 F (36.9 C) (Oral)   Resp 18   SpO2 96%   Visual Acuity Right Eye Distance:   Left Eye Distance:   Bilateral Distance:    Right Eye Near:   Left Eye Near:    Bilateral Near:     Physical Exam Constitutional:      Appearance: He is obese.  HENT:     Head: Normocephalic.     Right Ear: Tympanic membrane normal.     Left Ear: Tympanic membrane normal.     Nose: Congestion present.     Mouth/Throat:     Mouth: Mucous membranes are moist.  Eyes:     Pupils: Pupils are equal, round, and reactive to light.  Cardiovascular:     Rate and Rhythm: Normal rate.  Pulmonary:     Effort: Pulmonary effort is normal.     Breath sounds: Normal breath sounds.     Comments: Dry nonproductive cough Abdominal:     General: Abdomen is flat.  Musculoskeletal:        General: Normal range of motion.     Cervical back: Normal range of motion.  Skin:    General: Skin is warm.     Capillary Refill: Capillary refill takes less than 2 seconds.  Neurological:     General: No focal deficit present.     Mental Status: He is  alert.      UC Treatments / Results  Labs (all labs ordered are listed, but only abnormal results are displayed) Labs Reviewed  SARS CORONAVIRUS 2 (TAT 6-24 HRS)    EKG   Radiology No results found.  Procedures Procedures (including critical care time)  Medications Ordered in UC Medications - No data to display  Initial Impression / Assessment and Plan / UC Course  I have reviewed the triage vital signs and the nursing notes.  Pertinent labs & imaging results that were available during my care of the patient were reviewed by me and considered in my medical decision making (see chart for details).     Discussed with patient that symptoms are more viral in nature We will test for COVID patient is to check his MyChart for results Take over-the-counter cough and cold medications avoid anything with decongestants due to blood pressure is elevated.  Patient states that his blood pressure is always elevated when he comes to the doctor he has taken BP meds prior to arrival. Discussed with patient to avoid any marijuana use as this may cause increase in coughing and shortness of breath Take Tylenol Motrin as needed for pain or for fever Use inhaler as needed for shortness of breath If symptoms persist after 1 week patient will need to return for follow-up for possible chest x-ray or evaluation Final Clinical Impressions(s) / UC Diagnoses   Final diagnoses:  Acute cough  Viral illness  Left ear pain     Discharge Instructions      We will test for COVID patient is to check his MyChart for results Take over-the-counter cough and cold medications avoid anything with decongestants due to blood pressure is elevated.     ED Prescriptions     Medication Sig Dispense Auth. Provider   predniSONE (STERAPRED UNI-PAK 21 TAB) 10 MG (21) TBPK tablet Take by mouth daily. Take 6 tabs by mouth daily  for 2 days, then 5 tabs for 2 days, then 4 tabs for 2 days, then 3 tabs for 2 days,  2 tabs for 2 days, then 1 tab by mouth daily for 2 days 42 tablet Morley Kos L, NP   albuterol (VENTOLIN HFA) 108 (90 Base) MCG/ACT inhaler Inhale 1-2 puffs into the lungs every 6 (six) hours as needed for wheezing or shortness of breath. 18 g Morley Kos L, NP   levocetirizine (XYZAL) 5 MG tablet Take 1 tablet (5 mg total) by mouth every evening. 30 tablet Marney Setting, NP      PDMP not reviewed this encounter.   Marney Setting, NP 08/24/22 [phone]

## 2022-08-25 LAB — SARS CORONAVIRUS 2 (TAT 6-24 HRS): SARS Coronavirus 2: NEGATIVE

## 2022-08-26 ENCOUNTER — Telehealth: Payer: Managed Care, Other (non HMO) | Admitting: Nurse Practitioner

## 2022-08-26 DIAGNOSIS — J0140 Acute pansinusitis, unspecified: Secondary | ICD-10-CM

## 2022-08-26 DIAGNOSIS — J40 Bronchitis, not specified as acute or chronic: Secondary | ICD-10-CM

## 2022-08-26 MED ORDER — DOXYCYCLINE HYCLATE 100 MG PO TABS: 100.0000 mg | ORAL_TABLET | Freq: Two times a day (BID) | ORAL | 0 refills | Status: AC

## 2022-08-26 NOTE — Progress Notes (Signed)
Virtual Visit Consent   NITISH ROES, you are scheduled for a virtual visit with a [facility] provider today. Just as with appointments in the office, your consent must be obtained to participate. Your consent will be active for this visit and any virtual visit you may have with one of our providers in the next 365 days. If you have a MyChart account, a copy of this consent can be sent to you electronically.  As this is a virtual visit, video technology does not allow for your provider to perform a traditional examination. This may limit your provider's ability to fully assess your condition. If your provider identifies any concerns that need to be evaluated in person or the need to arrange testing (such as labs, EKG, etc.), we will make arrangements to do so. Although advances in technology are sophisticated, we cannot ensure that it will always work on either your end or our end. If the connection with a video visit is poor, the visit may have to be switched to a telephone visit. With either a video or telephone visit, we are not always able to ensure that we have a secure connection.  By engaging in this virtual visit, you consent to the provision of healthcare and authorize for your insurance to be billed (if applicable) for the services provided during this visit. Depending on your insurance coverage, you may receive a charge related to this service.  I need to obtain your verbal consent now. Are you willing to proceed with your visit today? Andrew Gibson has provided verbal consent on 08/26/2022 for a virtual visit (video or telephone). Apolonio Schneiders, FNP  Date: 08/26/2022 3:25 PM  Virtual Visit via Video Note   I, Apolonio Schneiders, connected with  Andrew Gibson  ([id number], [birth year]) on 08/26/22 at  3:30 PM EST by a video-enabled telemedicine application and verified that I am speaking with the correct person using two identifiers.  Location: Patient: Virtual Visit Location Patient:  Home Provider: Virtual Visit Location Provider: Home Office   I discussed the limitations of evaluation and management by telemedicine and the availability of in person appointments. The patient expressed understanding and agreed to proceed.    History of Present Illness: Andrew Gibson is a 37 y.o. who identifies as a male who was assigned male at birth, and is being seen today for cough.   He was seen at UC 3 days ago with sinus congestion and cough that have been present for the past week   He was treated with prednisone(taper) on day 3 and albuterol inhaler Feels the medication has started to break up his cough   He has had a fever started last night (low grade) Since that time his cough has become more productive He now has ear pressure and pain  Sinus congestion persists  Feels his sinus congestion has worsened as well   COVID was negative at UC   Problems: There are no problems to display for this patient.   Allergies:  Allergies  Allergen Reactions   Other     Tomato paste   Medications:    Current Outpatient Medications:    acetaminophen (TYLENOL) 500 MG tablet, Take 1,000 mg by mouth every 6 (six) hours as needed for moderate pain or headache., Disp: , Rfl:    albuterol (VENTOLIN HFA) 108 (90 Base) MCG/ACT inhaler, Inhale 1-2 puffs into the lungs every 6 (six) hours as needed for wheezing or shortness of breath., Disp: 18 g, Rfl: 0  atenolol (TENORMIN) 50 MG tablet, Take 1 tablet by mouth daily., Disp: , Rfl:    levocetirizine (XYZAL) 5 MG tablet, Take 1 tablet (5 mg total) by mouth every evening., Disp: 30 tablet, Rfl: 0   lisinopril-hydrochlorothiazide (ZESTORETIC) 20-12.5 MG tablet, Take 2 tablets by mouth daily., Disp: , Rfl:    metFORMIN (GLUCOPHAGE) 1000 MG tablet, Take 1 tablet by mouth 2 (two) times daily., Disp: , Rfl:    predniSONE (STERAPRED UNI-PAK 21 TAB) 10 MG (21) TBPK tablet, Take by mouth daily. Take 6 tabs by mouth daily  for 2 days, then 5 tabs for  2 days, then 4 tabs for 2 days, then 3 tabs for 2 days, 2 tabs for 2 days, then 1 tab by mouth daily for 2 days, Disp: 42 tablet, Rfl: 0   sertraline (ZOLOFT) 100 MG tablet, Take by mouth., Disp: , Rfl:    traMADol (ULTRAM) 50 MG tablet, Take 1 tablet (50 mg total) by mouth every 6 (six) hours as needed., Disp: 15 tablet, Rfl: 0   Observations/Objective: Patient is well-developed, well-nourished in no acute distress.  Resting comfortably  at home.  Head is normocephalic, atraumatic.  No labored breathing.  Speech is clear and coherent with logical content.  Patient is alert and oriented at baseline.    Assessment and Plan: 1. Acute non-recurrent pansinusitis  - doxycycline (VIBRA-TABS) 100 MG tablet; Take 1 tablet (100 mg total) by mouth 2 (two) times daily for 10 days.  Dispense: 20 tablet; Refill: 0  2. Bronchitis  - doxycycline (VIBRA-TABS) 100 MG tablet; Take 1 tablet (100 mg total) by mouth 2 (two) times daily for 10 days.  Dispense: 20 tablet; Refill: 0    Start Mucinex  Continue prednisone taper  Continue to use Albuterol as needed   Push fluids and rest   Follow Up Instructions: I discussed the assessment and treatment plan with the patient. The patient was provided an opportunity to ask questions and all were answered. The patient agreed with the plan and demonstrated an understanding of the instructions.  A copy of instructions were sent to the patient via MyChart unless otherwise noted below.    The patient was advised to call back or seek an in-person evaluation if the symptoms worsen or if the condition fails to improve as anticipated.  Time:  I spent 10 minutes with the patient via telehealth technology discussing the above problems/concerns.    Apolonio Schneiders, FNP

## 2023-03-18 ENCOUNTER — Encounter: Payer: Self-pay | Admitting: Emergency Medicine

## 2023-03-18 ENCOUNTER — Ambulatory Visit
Admission: EM | Admit: 2023-03-18 | Discharge: 2023-03-18 | Disposition: A | Discharge status: Home / Self Care | Payer: Managed Care, Other (non HMO)

## 2023-03-18 DIAGNOSIS — R197 Diarrhea, unspecified: Secondary | ICD-10-CM | POA: Diagnosis not present

## 2023-03-18 DIAGNOSIS — R112 Nausea with vomiting, unspecified: Secondary | ICD-10-CM

## 2023-03-18 DIAGNOSIS — B349 Viral infection, unspecified: Secondary | ICD-10-CM | POA: Diagnosis not present

## 2023-03-18 LAB — POCT FASTING CBG KUC MANUAL ENTRY: POCT Glucose (KUC): 203 mg/dL — AB (ref 70–99)

## 2023-03-18 MED ORDER — ONDANSETRON 4 MG PO TBDP: 4.0000 mg | ORAL_TABLET | Freq: Three times a day (TID) | ORAL | 0 refills | Status: DC | PRN

## 2023-03-18 NOTE — ED Triage Notes (Signed)
Pt reports nausea and vomiting the past 3 days. Denies any other symptoms. Unsure what precipitated symptoms. Reports he takes metformin and feels like he missed a week of taking the medication and has not missed any doses.

## 2023-03-18 NOTE — ED Provider Notes (Signed)
[facility]    CSN: [id number] Arrival date & time: 03/18/23  1227      History   Chief Complaint Chief Complaint  Patient presents with   Nausea    HPI Andrew Gibson is a 37 y.o. male.   Patient presents with nausea, vomiting, diarrhea that started about 3 days ago.  Reports diarrhea has now resolved and symptoms are starting to improve.  He states that he does still have a little bit of nausea and vomiting.  He has been able to tolerate fluids over the past few days.  Denies fever, body aches, chills.  Reports that there has been several coworkers at his job that have had similar symptoms.  Denies blood in stool or emesis.  Reports some abdominal discomfort that he describes as "gurgling".  Reports that he originally thought that it was related to his metformin as he did not take it for approximately 1 week and then restarted it.  Although, he restarted it about 2 weeks ago and symptoms started 3 days ago.  Blood pressure is also elevated but he reports that he has not taken his blood pressure medication today.  Patient not reporting any chest pain, shortness of breath, dizziness, blurred vision.      Past Medical History:  Diagnosis Date   Diabetes mellitus without complication (HCC)    Hydradenitis    Hypertension    Hypochondria    Kidney stone    OCD (obsessive compulsive disorder)     Patient Active Problem List   Diagnosis Date Noted   Hypertension 09/11/2020   OCD (obsessive compulsive disorder) 09/22/2019   MDD (major depressive disorder) 09/12/2018   Class 3 severe obesity due to excess calories with serious comorbidity and body mass index (BMI) of 50.0 to 59.9 in adult [facility]) 07/05/2014   Umbilical hernia without obstruction or gangrene 07/05/2014    Past Surgical History:  Procedure Laterality Date   ANKLE SURGERY     HERNIA REPAIR     PILONIDAL CYST DRAINAGE     WISDOM TOOTH EXTRACTION         Home Medications    Prior to Admission  medications   Medication Sig Start Date End Date Taking? Authorizing Provider  buPROPion (WELLBUTRIN XL) 150 MG 24 hr tablet Take 1 tablet by mouth every morning. 11/24/21  Yes [provider]  ondansetron (ZOFRAN-ODT) 4 MG disintegrating tablet Take 1 tablet (4 mg total) by mouth every 8 (eight) hours as needed for nausea or vomiting. 03/18/23  Yes Melvern Ramone, Rolly Salter E, FNP  acetaminophen (TYLENOL) 500 MG tablet Take 1,000 mg by mouth every 6 (six) hours as needed for moderate pain or headache.    [provider]  albuterol (VENTOLIN HFA) 108 (90 Base) MCG/ACT inhaler Inhale 1-2 puffs into the lungs every 6 (six) hours as needed for wheezing or shortness of breath. 08/24/22   Coralyn Mark, NP  atenolol (TENORMIN) 50 MG tablet Take 1 tablet by mouth daily. 11/22/21   [provider]  levocetirizine (XYZAL) 5 MG tablet Take 1 tablet (5 mg total) by mouth every evening. 08/24/22   Coralyn Mark, NP  lisinopril-hydrochlorothiazide (ZESTORETIC) 20-12.5 MG tablet Take 2 tablets by mouth daily. 11/22/21   [provider]  metFORMIN (GLUCOPHAGE) 1000 MG tablet Take 1 tablet by mouth 2 (two) times daily. 03/25/21   [provider]  predniSONE (STERAPRED UNI-PAK 21 TAB) 10 MG (21) TBPK tablet Take by mouth daily. Take 6 tabs by mouth  daily  for 2 days, then 5 tabs for 2 days, then 4 tabs for 2 days, then 3 tabs for 2 days, 2 tabs for 2 days, then 1 tab by mouth daily for 2 days 08/24/22   Coralyn Mark, NP  sertraline (ZOLOFT) 100 MG tablet Take by mouth. 11/22/21   [provider]  traMADol (ULTRAM) 50 MG tablet Take 1 tablet (50 mg total) by mouth every 6 (six) hours as needed. 01/21/17   Janne Napoleon, NP    Family History Family History  Problem Relation Age of Onset   Cancer Other    Hypertension Other     Social History Social History   Tobacco Use   Smoking status: Every Day    Current packs/day: 0.50    Types: Cigarettes   Smokeless tobacco:  Never  Vaping Use   Vaping status: Never Used  Substance Use Topics   Alcohol use: Not Currently   Drug use: Yes    Types: Marijuana     Allergies   Other   Review of Systems Review of Systems Per HPI  Physical Exam Triage Vital Signs ED Triage Vitals  Encounter Vitals Group     BP 03/18/23 1247 (!) 180/102     Systolic BP Percentile --      Diastolic BP Percentile --      Pulse Rate 03/18/23 1247 79     Resp 03/18/23 1247 17     Temp 03/18/23 1247 98.2 F (36.8 C)     Temp Source 03/18/23 1247 Oral     SpO2 03/18/23 1247 95 %     Weight --      Height --      Head Circumference --      Peak Flow --      Pain Score 03/18/23 1248 6     Pain Loc --      Pain Education --      Exclude from Growth Chart --    No data found.  Updated Vital Signs BP (!) 180/102 (BP Location: Left Arm) Comment: states he hasnt taken his BP medication today and has medical anxiety.  Pulse 79   Temp 98.2 F (36.8 C) (Oral)   Resp 17   SpO2 95%   Visual Acuity Right Eye Distance:   Left Eye Distance:   Bilateral Distance:    Right Eye Near:   Left Eye Near:    Bilateral Near:     Physical Exam Constitutional:      General: He is not in acute distress.    Appearance: Normal appearance. He is not toxic-appearing or diaphoretic.  HENT:     Head: Normocephalic and atraumatic.     Mouth/Throat:     Mouth: Mucous membranes are moist.     Pharynx: No posterior oropharyngeal erythema.  Eyes:     Extraocular Movements: Extraocular movements intact.     Conjunctiva/sclera: Conjunctivae normal.  Cardiovascular:     Rate and Rhythm: Normal rate and regular rhythm.     Pulses: Normal pulses.     Heart sounds: Normal heart sounds.  Pulmonary:     Effort: Pulmonary effort is normal.  Abdominal:     General: Bowel sounds are normal. There is no distension.     Palpations: Abdomen is soft.     Tenderness: There is no abdominal tenderness.  Neurological:     General: No focal  deficit present.     Mental Status: He is alert and oriented  to person, place, and time. Mental status is at baseline.  Psychiatric:        Mood and Affect: Mood normal.        Behavior: Behavior normal.        Thought Content: Thought content normal.        Judgment: Judgment normal.      UC Treatments / Results  Labs (all labs ordered are listed, but only abnormal results are displayed) Labs Reviewed  POCT FASTING CBG [facility] MANUAL ENTRY - Abnormal; Notable for the following components:      Result Value   POCT Glucose ([facility]) 203 (*)    All other components within normal limits    EKG   Radiology No results found.  Procedures Procedures (including critical care time)  Medications Ordered in UC Medications - No data to display  Initial Impression / Assessment and Plan / UC Course  I have reviewed the triage vital signs and the nursing notes.  Pertinent labs & imaging results that were available during my care of the patient were reviewed by me and considered in my medical decision making (see chart for details).     Suspect viral gastroenteritis given known sick exposures.  There are no signs of acute abdomen or dehydration on exam at this time so do not think that emergent evaluation or imaging is necessary.  Blood glucose was checked which was slightly elevated but unremarkable.  Do not think that glucose is related to symptoms.  Blood pressure is also elevated but he has not taken his blood pressure medication so low suspicion that nausea and vomiting are related to blood pressure.  Patient advised to ensure adequate fluid hydration, bland diet, rest.  Ondansetron prescribed to continue for nausea.  Advised him to use this sparingly as he does take sertraline.  Also advised patient to make sure that he takes his blood pressure medication as soon as possible.  Advised strict return and ER precautions.  Patient verbalized understanding and was agreeable with plan. Final  Clinical Impressions(s) / UC Diagnoses   Final diagnoses:  Nausea vomiting and diarrhea  Viral illness     Discharge Instructions      Suspect that you have a viral illness.  I have prescribed you nausea medication to take as needed.  Ensure adequate fluid hydration and rest.  Please take your blood pressure medication when you get home as well.    ED Prescriptions     Medication Sig Dispense Auth. Provider   ondansetron (ZOFRAN-ODT) 4 MG disintegrating tablet Take 1 tablet (4 mg total) by mouth every 8 (eight) hours as needed for nausea or vomiting. 20 tablet [place], Acie Fredrickson, [place]      PDMP not reviewed this encounter.   Gustavus Bryant, [place] 03/18/23 [phone]

## 2023-03-18 NOTE — Discharge Instructions (Signed)
Suspect that you have a viral illness.  I have prescribed you nausea medication to take as needed.  Ensure adequate fluid hydration and rest.  Please take your blood pressure medication when you get home as well.

## 2023-04-26 ENCOUNTER — Other Ambulatory Visit: Payer: Self-pay

## 2023-04-26 ENCOUNTER — Ambulatory Visit
Admission: EM | Admit: 2023-04-26 | Discharge: 2023-04-26 | Disposition: A | Discharge status: Home / Self Care | Payer: Managed Care, Other (non HMO) | Attending: Family Medicine | Admitting: Family Medicine

## 2023-04-26 DIAGNOSIS — L0201 Cutaneous abscess of face: Secondary | ICD-10-CM

## 2023-04-26 MED ORDER — DOXYCYCLINE HYCLATE 100 MG PO CAPS: 100.0000 mg | ORAL_CAPSULE | Freq: Two times a day (BID) | ORAL | 0 refills | Status: DC

## 2023-04-26 MED ORDER — IBUPROFEN 800 MG PO TABS: 800.0000 mg | ORAL_TABLET | Freq: Three times a day (TID) | ORAL | 0 refills | Status: DC

## 2023-04-26 NOTE — Discharge Instructions (Signed)
Take medications with food Take the doxycycline 2 times a day Take ibuprofen up to 3 times a day Use warm compresses to area Call for problems

## 2023-04-26 NOTE — ED Provider Notes (Signed)
Andrew Gibson C[facility]    CSN: [id number] Arrival date & time: 04/26/23  1211      History   Chief Complaint Chief Complaint  Patient presents with   Abscess    HPI Andrew Gibson is a 37 y.o. male.   HPI Patient has a rapidly growing abscess on his face.  He has had this before and it needed to be lanced.  He wants to come in this time before it gets as bad.  He states it is at the left corner of his mouth.  It hurts when he talks, smiles, and chews.  Past Medical History:  Diagnosis Date   Diabetes mellitus without complication (HCC)    Hydradenitis    Hypertension    Hypochondria    Kidney stone    OCD (obsessive compulsive disorder)     Patient Active Problem List   Diagnosis Date Noted   Hypertension 09/11/2020   OCD (obsessive compulsive disorder) 09/22/2019   MDD (major depressive disorder) 09/12/2018   Class 3 severe obesity due to excess calories with serious comorbidity and body mass index (BMI) of 50.0 to 59.9 in adult [facility]) 07/05/2014   Umbilical hernia without obstruction or gangrene 07/05/2014    Past Surgical History:  Procedure Laterality Date   ANKLE SURGERY     HERNIA REPAIR     PILONIDAL CYST DRAINAGE     WISDOM TOOTH EXTRACTION         Home Medications    Prior to Admission medications   Medication Sig Start Date End Date Taking? Authorizing Provider  doxycycline (VIBRAMYCIN) 100 MG capsule Take 1 capsule (100 mg total) by mouth 2 (two) times daily. 04/26/23  Yes Andrew Moore, MD  ibuprofen (ADVIL) 800 MG tablet Take 1 tablet (800 mg total) by mouth 3 (three) times daily. 04/26/23  Yes Andrew Moore, MD  atenolol (TENORMIN) 50 MG tablet Take 1 tablet by mouth daily. 11/22/21   [provider]  lisinopril-hydrochlorothiazide (ZESTORETIC) 20-12.5 MG tablet Take 2 tablets by mouth daily. 11/22/21   [provider]  metFORMIN (GLUCOPHAGE) 1000 MG tablet Take 1 tablet by mouth 2 (two) times daily. 03/25/21   [provider]  sertraline (ZOLOFT) 100 MG tablet Take by mouth. 11/22/21   [provider]    Family History Family History  Problem Relation Age of Onset   Cancer Other    Hypertension Other     Social History Social History   Tobacco Use   Smoking status: Every Day    Current packs/day: 0.50    Types: Cigarettes   Smokeless tobacco: Never  Vaping Use   Vaping status: Never Used  Substance Use Topics   Alcohol use: Not Currently   Drug use: Yes    Types: Marijuana     Allergies   Other   Review of Systems Review of Systems See HPI  Physical Exam Triage Vital Signs ED Triage Vitals  Encounter Vitals Group     BP 04/26/23 1230 (!) 188/80     Systolic BP Percentile --      Diastolic BP Percentile --      Pulse Rate 04/26/23 1230 71     Resp 04/26/23 1230 16     Temp 04/26/23 1230 98.4 F (36.9 C)     Temp Source 04/26/23 1230 Oral     SpO2 04/26/23 1230 98 %     Weight --      Height --  Head Circumference --      Peak Flow --      Pain Score 04/26/23 1233 8     Pain Loc --      Pain Education --      Exclude from Growth Chart --    No data found.  Updated Vital Signs BP (!) 188/80 (BP Location: Right Arm)   Pulse 71   Temp 98.4 F (36.9 C) (Oral)   Resp 16   SpO2 98%       Physical Exam Constitutional:      General: He is not in acute distress.    Appearance: He is well-developed.  HENT:     Head: Normocephalic and atraumatic.   Eyes:     Conjunctiva/sclera: Conjunctivae normal.     Pupils: Pupils are equal, round, and reactive to light.  Cardiovascular:     Rate and Rhythm: Normal rate.  Pulmonary:     Effort: Pulmonary effort is normal. No respiratory distress.  Abdominal:     General: There is no distension.     Palpations: Abdomen is soft.  Musculoskeletal:        General: Normal range of motion.     Cervical back: Normal range of motion.  Lymphadenopathy:     Cervical: No cervical adenopathy.  Skin:     General: Skin is warm and dry.  Neurological:     Mental Status: He is alert.      UC Treatments / Results  Labs (all labs ordered are listed, but only abnormal results are displayed) Labs Reviewed - No data to display  EKG   Radiology No results found.  Procedures Procedures (including critical care time)  Medications Ordered in UC Medications - No data to display  Initial Impression / Assessment and Plan / UC Course  I have reviewed the triage vital signs and the nursing notes.  Pertinent labs & imaging results that were available during my care of the patient were reviewed by me and considered in my medical decision making (see chart for details).     This is not ready to lance.  Will use warm compresses and antibiotics.  Wound care discussed Final Clinical Impressions(s) / UC Diagnoses   Final diagnoses:  Acute abscess of face     Discharge Instructions      Take medications with food Take the doxycycline 2 times a day Take ibuprofen up to 3 times a day Use warm compresses to area Call for problems   ED Prescriptions     Medication Sig Dispense Auth. Provider   doxycycline (VIBRAMYCIN) 100 MG capsule Take 1 capsule (100 mg total) by mouth 2 (two) times daily. 14 capsule Andrew Moore, MD   ibuprofen (ADVIL) 800 MG tablet Take 1 tablet (800 mg total) by mouth 3 (three) times daily. 21 tablet Andrew Moore, MD      PDMP not reviewed this encounter.   Andrew Moore, MD 04/26/23 1341

## 2023-04-26 NOTE — ED Triage Notes (Addendum)
C/o abscess on left side of face, causing pain

## 2023-06-19 ENCOUNTER — Encounter (HOSPITAL_COMMUNITY)
Admission: EM | Disposition: A | Discharge status: Home / Self Care | Payer: Self-pay | Source: Home / Self Care | Attending: Internal Medicine

## 2023-06-19 ENCOUNTER — Inpatient Hospital Stay (HOSPITAL_COMMUNITY)
Admission: EM | Admit: 2023-06-19 | Discharge: 2023-06-21 | DRG: 717 | Disposition: A | Discharge status: Home / Self Care | Payer: Managed Care, Other (non HMO) | Attending: Internal Medicine | Admitting: Internal Medicine

## 2023-06-19 ENCOUNTER — Inpatient Hospital Stay (HOSPITAL_COMMUNITY): Payer: Managed Care, Other (non HMO) | Admitting: Anesthesiology

## 2023-06-19 ENCOUNTER — Other Ambulatory Visit: Payer: Self-pay

## 2023-06-19 ENCOUNTER — Emergency Department (HOSPITAL_COMMUNITY): Payer: Managed Care, Other (non HMO)

## 2023-06-19 ENCOUNTER — Encounter (HOSPITAL_COMMUNITY): Payer: Self-pay

## 2023-06-19 DIAGNOSIS — N5089 Other specified disorders of the male genital organs: Secondary | ICD-10-CM | POA: Diagnosis present

## 2023-06-19 DIAGNOSIS — E66813 Obesity, class 3: Secondary | ICD-10-CM | POA: Diagnosis present

## 2023-06-19 DIAGNOSIS — L02215 Cutaneous abscess of perineum: Secondary | ICD-10-CM | POA: Diagnosis present

## 2023-06-19 DIAGNOSIS — N492 Inflammatory disorders of scrotum: Secondary | ICD-10-CM

## 2023-06-19 DIAGNOSIS — F329 Major depressive disorder, single episode, unspecified: Secondary | ICD-10-CM | POA: Diagnosis present

## 2023-06-19 DIAGNOSIS — I10 Essential (primary) hypertension: Secondary | ICD-10-CM | POA: Diagnosis present

## 2023-06-19 DIAGNOSIS — F429 Obsessive-compulsive disorder, unspecified: Secondary | ICD-10-CM | POA: Diagnosis present

## 2023-06-19 DIAGNOSIS — Z8249 Family history of ischemic heart disease and other diseases of the circulatory system: Secondary | ICD-10-CM | POA: Diagnosis not present

## 2023-06-19 DIAGNOSIS — Z87442 Personal history of urinary calculi: Secondary | ICD-10-CM | POA: Diagnosis not present

## 2023-06-19 DIAGNOSIS — Z6841 Body Mass Index (BMI) 40.0 and over, adult: Secondary | ICD-10-CM | POA: Diagnosis not present

## 2023-06-19 DIAGNOSIS — E876 Hypokalemia: Secondary | ICD-10-CM | POA: Diagnosis present

## 2023-06-19 DIAGNOSIS — T502X5A Adverse effect of carbonic-anhydrase inhibitors, benzothiadiazides and other diuretics, initial encounter: Secondary | ICD-10-CM | POA: Diagnosis present

## 2023-06-19 DIAGNOSIS — E1165 Type 2 diabetes mellitus with hyperglycemia: Secondary | ICD-10-CM | POA: Diagnosis present

## 2023-06-19 DIAGNOSIS — E871 Hypo-osmolality and hyponatremia: Secondary | ICD-10-CM | POA: Diagnosis present

## 2023-06-19 DIAGNOSIS — Z7984 Long term (current) use of oral hypoglycemic drugs: Secondary | ICD-10-CM

## 2023-06-19 DIAGNOSIS — Z9109 Other allergy status, other than to drugs and biological substances: Secondary | ICD-10-CM

## 2023-06-19 DIAGNOSIS — Z79899 Other long term (current) drug therapy: Secondary | ICD-10-CM

## 2023-06-19 DIAGNOSIS — L732 Hidradenitis suppurativa: Secondary | ICD-10-CM | POA: Diagnosis present

## 2023-06-19 DIAGNOSIS — F17210 Nicotine dependence, cigarettes, uncomplicated: Secondary | ICD-10-CM | POA: Diagnosis present

## 2023-06-19 HISTORY — PX: IRRIGATION AND DEBRIDEMENT ABSCESS: SHX5252

## 2023-06-19 LAB — GLUCOSE, CAPILLARY
Glucose-Capillary: 248 mg/dL — ABNORMAL HIGH (ref 70–99)
Glucose-Capillary: 294 mg/dL — ABNORMAL HIGH (ref 70–99)
Glucose-Capillary: 405 mg/dL — ABNORMAL HIGH (ref 70–99)

## 2023-06-19 LAB — CBC WITH DIFFERENTIAL/PLATELET
Abs Immature Granulocytes: 0.02 10*3/uL (ref 0.00–0.07)
Basophils Absolute: 0 10*3/uL (ref 0.0–0.1)
Basophils Relative: 0 %
Eosinophils Absolute: 0.1 10*3/uL (ref 0.0–0.5)
Eosinophils Relative: 2 %
HCT: 39.5 % (ref 39.0–52.0)
Hemoglobin: 13.4 g/dL (ref 13.0–17.0)
Immature Granulocytes: 0 %
Lymphocytes Relative: 15 %
Lymphs Abs: 1.2 10*3/uL (ref 0.7–4.0)
MCH: 27.7 pg (ref 26.0–34.0)
MCHC: 33.9 g/dL (ref 30.0–36.0)
MCV: 81.6 fL (ref 80.0–100.0)
Monocytes Absolute: 0.5 10*3/uL (ref 0.1–1.0)
Monocytes Relative: 6 %
Neutro Abs: 6.1 10*3/uL (ref 1.7–7.7)
Neutrophils Relative %: 77 %
Platelets: 198 10*3/uL (ref 150–400)
RBC: 4.84 MIL/uL (ref 4.22–5.81)
RDW: 14.4 % (ref 11.5–15.5)
WBC: 7.9 10*3/uL (ref 4.0–10.5)
nRBC: 0 % (ref 0.0–0.2)

## 2023-06-19 LAB — CBG MONITORING, ED: Glucose-Capillary: 295 mg/dL — ABNORMAL HIGH (ref 70–99)

## 2023-06-19 LAB — BASIC METABOLIC PANEL
Anion gap: 9 (ref 5–15)
BUN: 11 mg/dL (ref 6–20)
CO2: 27 mmol/L (ref 22–32)
Calcium: 8.9 mg/dL (ref 8.9–10.3)
Chloride: 95 mmol/L — ABNORMAL LOW (ref 98–111)
Creatinine, Ser: 0.63 mg/dL (ref 0.61–1.24)
GFR, Estimated: >60 mL/min (ref >60)
Glucose, Bld: 331 mg/dL — ABNORMAL HIGH (ref 70–99)
Potassium: 3.2 mmol/L — ABNORMAL LOW (ref 3.5–5.1)
Sodium: 131 mmol/L — ABNORMAL LOW (ref 135–145)

## 2023-06-19 SURGERY — IRRIGATION AND DEBRIDEMENT ABSCESS
Anesthesia: General | Site: Scrotum

## 2023-06-19 MED ORDER — OXYCODONE HCL 5 MG PO TABS: 5.0000 mg | ORAL_TABLET | ORAL | Status: DC | PRN

## 2023-06-19 MED ORDER — DEXAMETHASONE SODIUM PHOSPHATE 10 MG/ML IJ SOLN
INTRAMUSCULAR | Status: DC | PRN
  Administered 2023-06-19: 8 mg via INTRAVENOUS

## 2023-06-19 MED ORDER — ONDANSETRON HCL 4 MG/2ML IJ SOLN
INTRAMUSCULAR | Status: AC
  Filled 2023-06-19: qty 2

## 2023-06-19 MED ORDER — SERTRALINE HCL 100 MG PO TABS
200.0000 mg | ORAL_TABLET | Freq: Every day | ORAL | Status: DC
  Administered 2023-06-19 – 2023-06-21 (×3): 200 mg via ORAL
  Filled 2023-06-19 (×3): qty 2

## 2023-06-19 MED ORDER — NICOTINE 21 MG/24HR TD PT24: 21.0000 mg | MEDICATED_PATCH | Freq: Every day | TRANSDERMAL | Status: DC | PRN

## 2023-06-19 MED ORDER — ONDANSETRON HCL 4 MG/2ML IJ SOLN: 4.0000 mg | Freq: Once | INTRAMUSCULAR | Status: DC | PRN

## 2023-06-19 MED ORDER — ONDANSETRON HCL 4 MG/2ML IJ SOLN
INTRAMUSCULAR | Status: DC | PRN
  Administered 2023-06-19: 4 mg via INTRAVENOUS

## 2023-06-19 MED ORDER — FENTANYL CITRATE (PF) 100 MCG/2ML IJ SOLN
INTRAMUSCULAR | Status: AC
  Filled 2023-06-19: qty 2

## 2023-06-19 MED ORDER — HYDROCHLOROTHIAZIDE 12.5 MG PO TABS
12.5000 mg | ORAL_TABLET | Freq: Every day | ORAL | Status: DC
  Administered 2023-06-19 – 2023-06-21 (×3): 12.5 mg via ORAL
  Filled 2023-06-19 (×3): qty 1

## 2023-06-19 MED ORDER — OXYCODONE HCL 5 MG/5ML PO SOLN: 5.0000 mg | Freq: Once | ORAL | Status: AC | PRN

## 2023-06-19 MED ORDER — MORPHINE SULFATE (PF) 4 MG/ML IV SOLN
6.0000 mg | Freq: Once | INTRAVENOUS | Status: AC
  Administered 2023-06-19: 6 mg via INTRAVENOUS
  Filled 2023-06-19: qty 2

## 2023-06-19 MED ORDER — PIPERACILLIN-TAZOBACTAM 3.375 G IVPB
3.3750 g | Freq: Three times a day (TID) | INTRAVENOUS | Status: DC
  Administered 2023-06-19 – 2023-06-21 (×5): 3.375 g via INTRAVENOUS
  Filled 2023-06-19 (×6): qty 50

## 2023-06-19 MED ORDER — LISINOPRIL-HYDROCHLOROTHIAZIDE 20-12.5 MG PO TABS: 2.0000 | ORAL_TABLET | Freq: Every day | ORAL | Status: DC

## 2023-06-19 MED ORDER — DEXAMETHASONE SODIUM PHOSPHATE 10 MG/ML IJ SOLN
INTRAMUSCULAR | Status: AC
  Filled 2023-06-19: qty 1

## 2023-06-19 MED ORDER — PROPOFOL 10 MG/ML IV BOLUS
INTRAVENOUS | Status: AC
  Filled 2023-06-19: qty 20

## 2023-06-19 MED ORDER — VANCOMYCIN HCL IN DEXTROSE 1-5 GM/200ML-% IV SOLN: 1000.0000 mg | Freq: Once | INTRAVENOUS | Status: DC

## 2023-06-19 MED ORDER — FENTANYL CITRATE (PF) 100 MCG/2ML IJ SOLN
INTRAMUSCULAR | Status: DC | PRN
  Administered 2023-06-19: 100 ug via INTRAVENOUS

## 2023-06-19 MED ORDER — ACETAMINOPHEN 650 MG RE SUPP: 650.0000 mg | Freq: Four times a day (QID) | RECTAL | Status: DC | PRN

## 2023-06-19 MED ORDER — METFORMIN HCL 500 MG PO TABS: 1000.0000 mg | ORAL_TABLET | Freq: Two times a day (BID) | ORAL | Status: DC

## 2023-06-19 MED ORDER — DEXMEDETOMIDINE HCL IN NACL 80 MCG/20ML IV SOLN
INTRAVENOUS | Status: AC
  Filled 2023-06-19: qty 20

## 2023-06-19 MED ORDER — ACETAMINOPHEN 325 MG PO TABS: 325.0000 mg | ORAL_TABLET | ORAL | Status: DC | PRN

## 2023-06-19 MED ORDER — ONDANSETRON HCL 4 MG PO TABS
4.0000 mg | ORAL_TABLET | Freq: Four times a day (QID) | ORAL | Status: DC | PRN
  Administered 2023-06-21: 4 mg via ORAL
  Filled 2023-06-19: qty 1

## 2023-06-19 MED ORDER — IOHEXOL 300 MG/ML  SOLN
100.0000 mL | Freq: Once | INTRAMUSCULAR | Status: AC | PRN
  Administered 2023-06-19: 100 mL via INTRAVENOUS

## 2023-06-19 MED ORDER — LACTATED RINGERS IV SOLN: INTRAVENOUS | Status: DC | PRN

## 2023-06-19 MED ORDER — PIPERACILLIN-TAZOBACTAM 3.375 G IVPB 30 MIN
3.3750 g | Freq: Once | INTRAVENOUS | Status: AC
  Administered 2023-06-19: 3.375 g via INTRAVENOUS
  Filled 2023-06-19: qty 50

## 2023-06-19 MED ORDER — ATENOLOL 50 MG PO TABS
50.0000 mg | ORAL_TABLET | Freq: Every day | ORAL | Status: DC
  Administered 2023-06-19 – 2023-06-21 (×3): 50 mg via ORAL
  Filled 2023-06-19 (×3): qty 1

## 2023-06-19 MED ORDER — HYDROMORPHONE HCL 1 MG/ML IJ SOLN
1.0000 mg | INTRAMUSCULAR | Status: DC | PRN
  Administered 2023-06-19 – 2023-06-20 (×4): 1 mg via INTRAVENOUS
  Administered 2023-06-20: 0.5 mg via INTRAVENOUS
  Filled 2023-06-19 (×5): qty 1

## 2023-06-19 MED ORDER — LISINOPRIL 20 MG PO TABS
20.0000 mg | ORAL_TABLET | Freq: Every day | ORAL | Status: DC
  Administered 2023-06-19 – 2023-06-21 (×3): 20 mg via ORAL
  Filled 2023-06-19 (×3): qty 1

## 2023-06-19 MED ORDER — POTASSIUM CHLORIDE CRYS ER 20 MEQ PO TBCR
40.0000 meq | EXTENDED_RELEASE_TABLET | Freq: Every day | ORAL | Status: AC
  Administered 2023-06-19 – 2023-06-20 (×2): 40 meq via ORAL
  Filled 2023-06-19 (×2): qty 2

## 2023-06-19 MED ORDER — INSULIN ASPART 100 UNIT/ML IJ SOLN
6.0000 [IU] | Freq: Once | INTRAMUSCULAR | Status: AC
  Administered 2023-06-19: 6 [IU] via SUBCUTANEOUS

## 2023-06-19 MED ORDER — VANCOMYCIN HCL 1500 MG/300ML IV SOLN
1500.0000 mg | Freq: Two times a day (BID) | INTRAVENOUS | Status: DC
  Administered 2023-06-20 – 2023-06-21 (×3): 1500 mg via INTRAVENOUS
  Filled 2023-06-19 (×4): qty 300

## 2023-06-19 MED ORDER — FENTANYL CITRATE PF 50 MCG/ML IJ SOSY
25.0000 ug | PREFILLED_SYRINGE | INTRAMUSCULAR | Status: DC | PRN
  Administered 2023-06-19: 50 ug via INTRAVENOUS

## 2023-06-19 MED ORDER — LIDOCAINE HCL 1 % IJ SOLN
INTRAMUSCULAR | Status: DC | PRN
  Administered 2023-06-19: 80 mg via INTRADERMAL

## 2023-06-19 MED ORDER — LIDOCAINE HCL (PF) 2 % IJ SOLN
INTRAMUSCULAR | Status: AC
Start: 2023-06-19 — End: ?
  Filled 2023-06-19: qty 5

## 2023-06-19 MED ORDER — KETOROLAC TROMETHAMINE 30 MG/ML IJ SOLN
30.0000 mg | Freq: Four times a day (QID) | INTRAMUSCULAR | Status: AC
  Administered 2023-06-19 (×2): 30 mg via INTRAVENOUS
  Filled 2023-06-19 (×2): qty 1

## 2023-06-19 MED ORDER — FENTANYL CITRATE PF 50 MCG/ML IJ SOSY
PREFILLED_SYRINGE | INTRAMUSCULAR | Status: AC
  Administered 2023-06-19: 50 ug via INTRAVENOUS
  Filled 2023-06-19: qty 2

## 2023-06-19 MED ORDER — ONDANSETRON HCL 4 MG/2ML IJ SOLN: 4.0000 mg | Freq: Four times a day (QID) | INTRAMUSCULAR | Status: DC | PRN

## 2023-06-19 MED ORDER — OXYCODONE-ACETAMINOPHEN 5-325 MG PO TABS
1.0000 | ORAL_TABLET | Freq: Once | ORAL | Status: AC
  Administered 2023-06-19: 1 via ORAL
  Filled 2023-06-19: qty 1

## 2023-06-19 MED ORDER — LIDOCAINE HCL 1 % IJ SOLN
INTRAMUSCULAR | Status: DC | PRN
  Administered 2023-06-19: 10 mL

## 2023-06-19 MED ORDER — OXYCODONE HCL 5 MG PO TABS
ORAL_TABLET | ORAL | Status: AC
  Filled 2023-06-19: qty 1

## 2023-06-19 MED ORDER — LIDOCAINE HCL (PF) 1 % IJ SOLN
INTRAMUSCULAR | Status: AC
  Filled 2023-06-19: qty 30

## 2023-06-19 MED ORDER — PROPOFOL 10 MG/ML IV BOLUS
INTRAVENOUS | Status: DC | PRN
  Administered 2023-06-19: 50 mg via INTRAVENOUS
  Administered 2023-06-19: 100 mg via INTRAVENOUS
  Administered 2023-06-19: 50 mg via INTRAVENOUS
  Administered 2023-06-19: 200 mg via INTRAVENOUS

## 2023-06-19 MED ORDER — ACETAMINOPHEN 160 MG/5ML PO SOLN: 325.0000 mg | ORAL | Status: DC | PRN

## 2023-06-19 MED ORDER — INSULIN ASPART 100 UNIT/ML IJ SOLN
0.0000 [IU] | Freq: Three times a day (TID) | INTRAMUSCULAR | Status: DC
  Administered 2023-06-19: 11 [IU] via SUBCUTANEOUS
  Administered 2023-06-20: 15 [IU] via SUBCUTANEOUS
  Administered 2023-06-20 – 2023-06-21 (×4): 11 [IU] via SUBCUTANEOUS

## 2023-06-19 MED ORDER — DEXMEDETOMIDINE HCL IN NACL 80 MCG/20ML IV SOLN
INTRAVENOUS | Status: DC | PRN
  Administered 2023-06-19: 8 ug via INTRAVENOUS
  Administered 2023-06-19: 12 ug via INTRAVENOUS

## 2023-06-19 MED ORDER — ACETAMINOPHEN 325 MG PO TABS
650.0000 mg | ORAL_TABLET | Freq: Four times a day (QID) | ORAL | Status: DC | PRN
  Administered 2023-06-20 – 2023-06-21 (×2): 650 mg via ORAL
  Filled 2023-06-19 (×2): qty 2

## 2023-06-19 MED ORDER — OXYCODONE HCL 5 MG PO TABS
5.0000 mg | ORAL_TABLET | Freq: Once | ORAL | Status: AC | PRN
  Administered 2023-06-19: 5 mg via ORAL

## 2023-06-19 MED ORDER — MEPERIDINE HCL 50 MG/ML IJ SOLN
6.2500 mg | INTRAMUSCULAR | Status: DC | PRN
Start: 2023-06-19 — End: 2023-06-19

## 2023-06-19 SURGICAL SUPPLY — 37 items
ADH SKN CLS APL DERMABOND .7 (GAUZE/BANDAGES/DRESSINGS) ×1
APL SKNCLS STERI-STRIP NONHPOA (GAUZE/BANDAGES/DRESSINGS) ×1
BAG COUNTER SPONGE SURGICOUNT (BAG) IMPLANT
BAG SPNG CNTER NS LX DISP (BAG)
BENZOIN TINCTURE PRP APPL 2/3 (GAUZE/BANDAGES/DRESSINGS) ×2 IMPLANT
BLADE HEX COATED 2.75 (ELECTRODE) ×2 IMPLANT
BLADE SURG 15 STRL LF DISP TIS (BLADE) ×2 IMPLANT
BLADE SURG 15 STRL SS (BLADE) ×1
BNDG GAUZE DERMACEA FLUFF 4 (GAUZE/BANDAGES/DRESSINGS) ×2 IMPLANT
BNDG GZE DERMACEA 4 6PLY (GAUZE/BANDAGES/DRESSINGS) ×1
DERMABOND ADVANCED .7 DNX12 (GAUZE/BANDAGES/DRESSINGS) ×2 IMPLANT
DRAIN PENROSE 0.25X18 (DRAIN) ×2 IMPLANT
DRAIN PENROSE 0.5X18 (DRAIN) ×2 IMPLANT
DRAPE LAPAROTOMY T 98X78 PEDS (DRAPES) ×2 IMPLANT
ELECT REM PT RETURN 15FT ADLT (MISCELLANEOUS) ×2 IMPLANT
GAUZE PAD ABD 8X10 STRL (GAUZE/BANDAGES/DRESSINGS) IMPLANT
GAUZE SPONGE 4X4 12PLY STRL (GAUZE/BANDAGES/DRESSINGS) ×2 IMPLANT
GLOVE SURG LX STRL 7.5 STRW (GLOVE) ×2 IMPLANT
GOWN SRG XL LVL 4 BRTHBL STRL (GOWNS) ×2 IMPLANT
GOWN STRL NON-REIN XL LVL4 (GOWNS) ×1
KIT BASIN OR (CUSTOM PROCEDURE TRAY) ×2 IMPLANT
KIT TURNOVER KIT A (KITS) IMPLANT
NDL HYPO 22X1.5 SAFETY MO (MISCELLANEOUS) IMPLANT
NEEDLE HYPO 22X1.5 SAFETY MO (MISCELLANEOUS) IMPLANT
NS IRRIG 1000ML POUR BTL (IV SOLUTION) IMPLANT
PACK BASIC VI WITH GOWN DISP (CUSTOM PROCEDURE TRAY) ×2 IMPLANT
PENCIL SMOKE EVACUATOR (MISCELLANEOUS) IMPLANT
SPONGE T-LAP 4X18 ~~LOC~~+RFID (SPONGE) ×4 IMPLANT
SUPPORTER AHLETIC TETRA LG (SOFTGOODS) ×2 IMPLANT
SUT MNCRL AB 4-0 PS2 18 (SUTURE) ×2 IMPLANT
SUT SILK 0 (SUTURE) ×1
SUT SILK 0 30XBRD TIE 6 (SUTURE) ×2 IMPLANT
SUT VIC AB 3-0 SH 27 (SUTURE) ×1
SUT VIC AB 3-0 SH 27XBRD (SUTURE) ×2 IMPLANT
SYR 20ML LL LF (SYRINGE) ×2 IMPLANT
SYR CONTROL 10ML LL (SYRINGE) IMPLANT
WATER STERILE IRR 1000ML POUR (IV SOLUTION) IMPLANT

## 2023-06-19 NOTE — Anesthesia Procedure Notes (Signed)
Procedure Name: LMA Insertion Date/Time: 06/19/2023 3:25 PM  Performed by: Burgess Estelle, CRNAPre-anesthesia Checklist: Patient identified, Emergency Drugs available, Suction available and Patient being monitored Patient Re-evaluated:Patient Re-evaluated prior to induction Oxygen Delivery Method: Circle System Utilized Preoxygenation: Pre-oxygenation with 100% oxygen Induction Type: IV induction Ventilation: Mask ventilation without difficulty LMA: LMA inserted LMA Size: 5.0 Number of attempts: 1 Airway Equipment and Method: Bite block Placement Confirmation: positive ETCO2 Tube secured with: Tape Dental Injury: Teeth and Oropharynx as per pre-operative assessment

## 2023-06-19 NOTE — ED Provider Notes (Addendum)
[place] EMERGENCY DEPARTMENT AT [facility] Provider Note   CSN: [id number] Arrival date & time: 06/19/23  0131     History  Chief Complaint  Patient presents with   Abscess    Andrew Gibson is a 37 y.o. male.  HPI     37 year old male with history of hypertension, diabetes comes in with chief complaint of right-sided scrotal pain.  Patient states that he started noticing pain and discomfort in the right side of his scrotum about 5 days ago.  Over time there has been increasing swelling and pain.  Review of system is negative for any nausea, vomiting, fevers, chills, abdominal pain.  Patient had infection over the same site more than a year ago, but it healed on its own.  Home Medications Prior to Admission medications   Medication Sig Start Date End Date Taking? Authorizing Provider  atenolol (TENORMIN) 50 MG tablet Take 1 tablet by mouth daily. 11/22/21   [provider]  doxycycline (VIBRAMYCIN) 100 MG capsule Take 1 capsule (100 mg total) by mouth 2 (two) times daily. 04/26/23   Eustace Moore, MD  ibuprofen (ADVIL) 800 MG tablet Take 1 tablet (800 mg total) by mouth 3 (three) times daily. 04/26/23   Eustace Moore, MD  lisinopril-hydrochlorothiazide (ZESTORETIC) 20-12.5 MG tablet Take 2 tablets by mouth daily. 11/22/21   [provider]  metFORMIN (GLUCOPHAGE) 1000 MG tablet Take 1 tablet by mouth 2 (two) times daily. 03/25/21   [provider]  sertraline (ZOLOFT) 100 MG tablet Take by mouth. 11/22/21   [provider]      Allergies    Other    Review of Systems   Review of Systems  All other systems reviewed and are negative.   Physical Exam Updated Vital Signs BP (!) 177/89 (BP Location: Left Arm)   Pulse 75   Temp 98.2 F (36.8 C) (Oral)   Resp 17   SpO2 99%  Physical Exam Vitals and nursing note reviewed.  Constitutional:      Appearance: He is well-developed.  HENT:     Head: Atraumatic.  Cardiovascular:      Rate and Rhythm: Normal rate.  Pulmonary:     Effort: Pulmonary effort is normal.  Genitourinary:    Comments: Patient has right-sided edema right by the scrotal wall.  There is some hypertrophy and erythema of the skin right scrotal wall as well.  No discomfort/tenderness over the perineal region, pelvic region Musculoskeletal:     Cervical back: Neck supple.  Skin:    General: Skin is warm.  Neurological:     Mental Status: He is alert and oriented to person, place, and time.     ED Results / Procedures / Treatments   Labs (all labs ordered are listed, but only abnormal results are displayed) Labs Reviewed  BASIC METABOLIC PANEL - Abnormal; Notable for the following components:      Result Value   Sodium 131 (*)    Potassium 3.2 (*)    Chloride 95 (*)    Glucose, Bld 331 (*)    All other components within normal limits  CBG MONITORING, ED - Abnormal; Notable for the following components:   Glucose-Capillary 295 (*)    All other components within normal limits  CBC WITH DIFFERENTIAL/PLATELET    EKG None  Radiology CT PELVIS W CONTRAST  Result Date: 06/19/2023 CLINICAL DATA:  Soft tissue infection suspected, pelvis, xray done EXAM: CT PELVIS WITH CONTRAST TECHNIQUE: Multidetector CT  imaging of the pelvis was performed using the standard protocol following the bolus administration of intravenous contrast. RADIATION DOSE REDUCTION: This exam was performed according to the departmental dose-optimization program which includes automated exposure control, adjustment of the mA and/or kV according to patient size and/or use of iterative reconstruction technique. CONTRAST:  OMNIPAQUE IOHEXOL 300 MG/ML  SOLN COMPARISON:  06/02/2013 FINDINGS: Urinary Tract:  No abnormality visualized. Bowel: No evidence of bowel obstruction or active bowel inflammation within the pelvis. Scattered sigmoid diverticula. Vascular/Lymphatic: No significant vascular abnormality. Mildly prominent  bilateral inguinal and external iliac chain lymph nodes, likely reactive. No lymphadenopathy by size criteria. Reproductive:  Prostate gland is normal in appearance. Other:  No free air or free fluid within the pelvis. Musculoskeletal: No acute osseous abnormality. No suspicious bone lesion. Incidentally noted bone islands within the right iliac bone and right pubic. Inflammatory changes in the soft tissues of the perineum. There is a rim enhancing fluid and gas containing collection in the right perineum measuring 6.0 x 2.3 x 4.3 cm. Aside from this collection, there is no additional foci of gas within the soft tissues. Small bilateral hydroceles. IMPRESSION: 1. Inflammatory changes in the soft tissues of the perineum with a rim-enhancing fluid and gas containing collection in the right perineum measuring 6.0 x 2.3 x 4.3 cm, compatible with abscess. 2. Small bilateral hydroceles. 3. No acute intrapelvic abnormality. Electronically Signed   By: Duanne Guess D.O.   On: 06/19/2023 12:57   US Scrotum  Result Date: 06/19/2023 CLINICAL DATA:  Right-sided abscess. EXAM: ULTRASOUND OF SCROTUM TECHNIQUE: Complete ultrasound examination of the testicles, epididymis, and other scrotal structures was performed. COMPARISON:  None Available. FINDINGS: Right testicle Measurements: 4.3 x 2.0 x 3.2 cm. No mass or microlithiasis visualized. Left testicle Measurements: 4.1 x 2.4 x 2.9 cm. No mass or microlithiasis visualized. Right epididymis:  5 mm epididymal cyst or spermatocele. Left epididymis:  Normal in size and appearance. Hydrocele: Small right-sided hydrocele. Small left hydrocele contains low level internal echoes and some septations, Varicocele:  None visualized. Measurements: 4.3 x 3.0 x 3.2 cm. No mass or microlithiasis visualized. IMPRESSION: 1. Small bilateral hydroceles, left greater than right. The left hydrocele contains low level internal echoes and some septations, potentially from prior  infection/inflammation or hemorrhage. 2. 5 mm right epididymal cyst or spermatocele. 3. No testicular abnormality. Electronically Signed   By: Kennith Center M.D.   On: 06/19/2023 08:38    Procedures .Critical Care  Performed by: Derwood Kaplan, MD Authorized by: Derwood Kaplan, MD   Critical care provider statement:    Critical care time (minutes):  41   Critical care was necessary to treat or prevent imminent or life-threatening deterioration of the following conditions: Gas-forming abscess with cellulitis in the scrotal wall.   Critical care was time spent personally by me on the following activities:  Development of treatment plan with patient or surrogate, discussions with consultants, evaluation of patient's response to treatment, examination of patient, ordering and review of laboratory studies, ordering and review of radiographic studies, ordering and performing treatments and interventions, pulse oximetry, re-evaluation of patient's condition and review of old charts     Medications Ordered in ED Medications  oxyCODONE-acetaminophen (PERCOCET/ROXICET) 5-325 MG per tablet 1 tablet (1 tablet Oral Given 06/19/23 0733)  piperacillin-tazobactam (ZOSYN) IVPB 3.375 g (3.375 g Intravenous New Bag/Given 06/19/23 1240)  morphine (PF) 4 MG/ML injection 6 mg (6 mg Intravenous Given 06/19/23 1212)  iohexol (OMNIPAQUE) 300 MG/ML solution 100 mL (100  mLs Intravenous Contrast Given 06/19/23 1226)    ED Course/ Medical Decision Making/ A&P                                 Medical Decision Making Amount and/or Complexity of Data Reviewed Labs: ordered. Radiology: ordered.  Risk Prescription drug management. Decision regarding hospitalization.   This patient presents to the ED with chief complaint(s) of scrotal pain with pertinent past medical history of poorly controlled type 2 diabetes, hypertension, obesity.The complaint involves an extensive differential diagnosis and also carries with it a  high risk of complications and morbidity.    The differential diagnosis includes : Necrotizing fasciitis, scrotal wall abscess, hidradenitis suppurative, cellulitis of the scrotal wall, inguinal lymphadenopathy.  It does not appear that the testicles themselves are involved.  Low suspicion for epididymitis or testicular abscess.  The initial plan is to get basic labs, ultrasound of the scrotum.   Additional history obtained: Additional history obtained from spouse Records reviewed Primary Care Documents.  Patient has had abscess to his face before as well earlier this year.  Independent labs interpretation:  The following labs were independently interpreted: Patient's labs are overall reassuring.  Independent visualization and interpretation of imaging: - I independently visualized the following imaging with scope of interpretation limited to determining acute life threatening conditions related to emergency care: CT abdomen and pelvis, which revealed clear evidence of abscess.  He had an ultrasound done earlier that revealed no scrotal abscess. CT was ordered thereafter at the recommendation of general surgery.  If the CT is positive, per general surgery this should be paged out to urology.  At 33 I paged urology and spoke with the urologist on-call who is reviewing the images. The Ct shows gas forming abscess.  Clinically not septic.  1:39 PM Urology team to take patient to the OR later today.  Requesting that we add vancomycin.  Requesting medicine to admit.  Final Clinical Impression(s) / ED Diagnoses Final diagnoses:  Scrotal abscess    Rx / DC Orders ED Discharge Orders     None        Derwood Kaplan, MD 06/19/23 1341

## 2023-06-19 NOTE — Interval H&P Note (Signed)
History and Physical Interval Note:  06/19/2023 3:00 PM  Andrew Gibson  has presented today for surgery, with the diagnosis of SCROTAL ABSCESS.  The various methods of treatment have been discussed with the patient and family. After consideration of risks, benefits and other options for treatment, the patient has consented to  Procedure(s): IRRIGATION AND DEBRIDEMENT ABSCESS (N/A) as a surgical intervention.  The patient's history has been reviewed, patient examined, no change in status, stable for surgery.  I have reviewed the patient's chart and labs.  Questions were answered to the patient's satisfaction.     Di Kindle

## 2023-06-19 NOTE — ED Notes (Signed)
ED TO INPATIENT HANDOFF REPORT  Name/Age/Gender Andrew Gibson 37 y.o. male  Code Status   Home/SNF/Other Home  Chief Complaint Perineal abscess [L02.215]  Level of Care/Admitting Diagnosis ED Disposition     ED Disposition  Admit   Condition  --   Comment  Hospital Area: [facility] [[id number]]  Level of Care: Progressive [102]  Admit to Progressive based on following criteria: MULTISYSTEM THREATS such as stable sepsis, metabolic/electrolyte imbalance with or without encephalopathy that is responding to early treatment.  May admit patient to [facility] or [facility] if equivalent level of care is available:: No  Covid Evaluation: Asymptomatic - no recent exposure (last 10 days) testing not required  Diagnosis: Perineal abscess [[id number]]  Admitting Physician: Bobette Mo [[id number]]  Attending Physician: Bobette Mo [[id number]]  Certification:: I certify this patient will need inpatient services for at least 2 midnights  Expected Medical Readiness: 06/23/2023          Medical History Past Medical History:  Diagnosis Date   Diabetes mellitus without complication (HCC)    Hydradenitis    Hypertension    Hypochondria    Kidney stone    OCD (obsessive compulsive disorder)     Allergies Allergies  Allergen Reactions   Other Nausea And Vomiting and Rash    Tomato paste Black beans- fainting     IV Location/Drains/Wounds Patient Lines/Drains/Airways Status     Active Line/Drains/Airways     Name Placement date Placement time Site Days   Peripheral IV 06/19/23 20 G 1" Anterior;Distal;Left;Upper Arm 06/19/23  1204  Arm  less than 1            Labs/Imaging Results for orders placed or performed during the hospital encounter of 06/19/23 (from the past 48 hour(s))  CBC with Differential     Status: None   Collection Time: 06/19/23  8:27 AM  Result Value Ref Range   WBC 7.9 4.0 - 10.5 K/uL   RBC 4.84 4.22 - 5.81 MIL/uL    Hemoglobin 13.4 13.0 - 17.0 g/dL   HCT 96.2 95.2 - 84.1 %   MCV 81.6 80.0 - 100.0 fL   MCH 27.7 26.0 - 34.0 pg   MCHC 33.9 30.0 - 36.0 g/dL   RDW 32.4 40.1 - 02.7 %   Platelets 198 150 - 400 K/uL   nRBC 0.0 0.0 - 0.2 %   Neutrophils Relative % 77 %   Neutro Abs 6.1 1.7 - 7.7 K/uL   Lymphocytes Relative 15 %   Lymphs Abs 1.2 0.7 - 4.0 K/uL   Monocytes Relative 6 %   Monocytes Absolute 0.5 0.1 - 1.0 K/uL   Eosinophils Relative 2 %   Eosinophils Absolute 0.1 0.0 - 0.5 K/uL   Basophils Relative 0 %   Basophils Absolute 0.0 0.0 - 0.1 K/uL   Immature Granulocytes 0 %   Abs Immature Granulocytes 0.02 0.00 - 0.07 K/uL    Comment: Performed at [facility], [address], [place], [place] [address]  Basic metabolic panel     Status: Abnormal   Collection Time: 06/19/23  8:27 AM  Result Value Ref Range   Sodium 131 (L) 135 - 145 mmol/L   Potassium 3.2 (L) 3.5 - 5.1 mmol/L   Chloride 95 (L) 98 - 111 mmol/L   CO2 27 22 - 32 mmol/L   Glucose, Bld 331 (H) 70 - 99 mg/dL    Comment: Glucose reference range applies only to samples taken  after fasting for at least 8 hours.   BUN 11 6 - 20 mg/dL   Creatinine, Ser 1.61 0.61 - 1.24 mg/dL   Calcium 8.9 8.9 - 09.6 mg/dL   GFR, Estimated >04 >54 mL/min    Comment: (NOTE) Calculated using the CKD-EPI Creatinine Equation (2021)    Anion gap 9 5 - 15    Comment: Performed at [facility], [address], [place], [place] [address]  POC CBG, ED     Status: Abnormal   Collection Time: 06/19/23  8:33 AM  Result Value Ref Range   Glucose-Capillary 295 (H) 70 - 99 mg/dL    Comment: Glucose reference range applies only to samples taken after fasting for at least 8 hours.   CT PELVIS W CONTRAST  Result Date: 06/19/2023 CLINICAL DATA:  Soft tissue infection suspected, pelvis, xray done EXAM: CT PELVIS WITH CONTRAST TECHNIQUE: Multidetector CT imaging of the pelvis was performed using the standard protocol following  the bolus administration of intravenous contrast. RADIATION DOSE REDUCTION: This exam was performed according to the departmental dose-optimization program which includes automated exposure control, adjustment of the mA and/or kV according to patient size and/or use of iterative reconstruction technique. CONTRAST:  OMNIPAQUE IOHEXOL 300 MG/ML  SOLN COMPARISON:  06/02/2013 FINDINGS: Urinary Tract:  No abnormality visualized. Bowel: No evidence of bowel obstruction or active bowel inflammation within the pelvis. Scattered sigmoid diverticula. Vascular/Lymphatic: No significant vascular abnormality. Mildly prominent bilateral inguinal and external iliac chain lymph nodes, likely reactive. No lymphadenopathy by size criteria. Reproductive:  Prostate gland is normal in appearance. Other:  No free air or free fluid within the pelvis. Musculoskeletal: No acute osseous abnormality. No suspicious bone lesion. Incidentally noted bone islands within the right iliac bone and right pubic. Inflammatory changes in the soft tissues of the perineum. There is a rim enhancing fluid and gas containing collection in the right perineum measuring 6.0 x 2.3 x 4.3 cm. Aside from this collection, there is no additional foci of gas within the soft tissues. Small bilateral hydroceles. IMPRESSION: 1. Inflammatory changes in the soft tissues of the perineum with a rim-enhancing fluid and gas containing collection in the right perineum measuring 6.0 x 2.3 x 4.3 cm, compatible with abscess. 2. Small bilateral hydroceles. 3. No acute intrapelvic abnormality. Electronically Signed   By: Duanne Guess D.O.   On: 06/19/2023 12:57   US Scrotum  Result Date: 06/19/2023 CLINICAL DATA:  Right-sided abscess. EXAM: ULTRASOUND OF SCROTUM TECHNIQUE: Complete ultrasound examination of the testicles, epididymis, and other scrotal structures was performed. COMPARISON:  None Available. FINDINGS: Right testicle Measurements: 4.3 x 2.0 x 3.2 cm. No  mass or microlithiasis visualized. Left testicle Measurements: 4.1 x 2.4 x 2.9 cm. No mass or microlithiasis visualized. Right epididymis:  5 mm epididymal cyst or spermatocele. Left epididymis:  Normal in size and appearance. Hydrocele: Small right-sided hydrocele. Small left hydrocele contains low level internal echoes and some septations, Varicocele:  None visualized. Measurements: 4.3 x 3.0 x 3.2 cm. No mass or microlithiasis visualized. IMPRESSION: 1. Small bilateral hydroceles, left greater than right. The left hydrocele contains low level internal echoes and some septations, potentially from prior infection/inflammation or hemorrhage. 2. 5 mm right epididymal cyst or spermatocele. 3. No testicular abnormality. Electronically Signed   By: Kennith Center M.D.   On: 06/19/2023 08:38    Pending Labs Unresulted Labs (From admission, onward)    None       Vitals/Pain Today's Vitals   06/19/23  0730 06/19/23 0834 06/19/23 1252 06/19/23 1258  BP:  (!) 155/82  (!) 177/89  Pulse:  72  75  Resp:  11  17  Temp:  98.6 F (37 C)  98.2 F (36.8 C)  TempSrc:    Oral  SpO2:  99%  99%  PainSc: 7   5  5      Isolation Precautions No active isolations  Medications Medications  HYDROmorphone (DILAUDID) injection 1 mg (has no administration in time range)  vancomycin (VANCOCIN) IVPB 1000 mg/200 mL premix (has no administration in time range)    Followed by  vancomycin (VANCOCIN) IVPB 1000 mg/200 mL premix (has no administration in time range)  oxyCODONE-acetaminophen (PERCOCET/ROXICET) 5-325 MG per tablet 1 tablet (1 tablet Oral Given 06/19/23 0733)  piperacillin-tazobactam (ZOSYN) IVPB 3.375 g (0 g Intravenous Stopped 06/19/23 1416)  morphine (PF) 4 MG/ML injection 6 mg (6 mg Intravenous Given 06/19/23 1212)  iohexol (OMNIPAQUE) 300 MG/ML solution 100 mL (100 mLs Intravenous Contrast Given 06/19/23 1226)    Mobility walks

## 2023-06-19 NOTE — ED Notes (Signed)
Patient does not want an IV until the Dr comes to talk to him about the plan. The patient would prefer to take oral antibiotics.

## 2023-06-19 NOTE — Progress Notes (Signed)
A consult was received from an ED physician for zosyn per pharmacy dosing.  The patient's profile has been reviewed for ht/wt/allergies/indication/available labs.    A one time order has been placed for zosyn 3.375 gm IV x1 over 30 min.  Further antibiotics/pharmacy consults should be ordered by admitting physician if indicated.                       Thank you, Lucia Gaskins 06/19/2023  10:00 AM

## 2023-06-19 NOTE — Consult Note (Signed)
Urology Consult Note   Requesting Attending Physician:  Derwood Kaplan, MD Service Providing Consult: Urology  Consulting Attending: Pete Glatter   Reason for Consult:  Scrotal Abscess  HPI: Andrew Gibson is seen in consultation for reasons noted above at the request of Derwood Kaplan, MD for evaluation of scrotal abscess  This is a 37 y.o. male with history significant for uncontrolled diabetes, hypertension, hidradenitis, prior nephrolithiasis who presents with scrotal abscess that has been present for 5 days  He states that he first noticed a lump on the side of his right scrotum on Monday (5 days ago).  He states that it is slowly progressed in size, and has become more painful.  The pain was unbearable over the last 24 hours, so he decided present to emergency department  Reassuringly, his vital signs are stable.  He has no white count, and he has no reported fevers or subjective chills at home.  Scrotal ultrasound did not pick up the abscess, but CT abdomen and pelvis was obtained showing at scrotal abscess and right inferior scrotum that spans at least 6 cm.  There is also gas contained in his abscess, unclear if there is no tracking.   Past Medical History: Past Medical History:  Diagnosis Date   Diabetes mellitus without complication (HCC)    Hydradenitis    Hypertension    Hypochondria    Kidney stone    OCD (obsessive compulsive disorder)     Past Surgical History:  Past Surgical History:  Procedure Laterality Date   ANKLE SURGERY     HERNIA REPAIR     PILONIDAL CYST DRAINAGE     WISDOM TOOTH EXTRACTION      Medication: No current facility-administered medications for this encounter.   Current Outpatient Medications  Medication Sig Dispense Refill   atenolol (TENORMIN) 50 MG tablet Take 1 tablet by mouth daily.     doxycycline (VIBRAMYCIN) 100 MG capsule Take 1 capsule (100 mg total) by mouth 2 (two) times daily. 14 capsule 0   ibuprofen (ADVIL) 800 MG  tablet Take 1 tablet (800 mg total) by mouth 3 (three) times daily. 21 tablet 0   lisinopril-hydrochlorothiazide (ZESTORETIC) 20-12.5 MG tablet Take 2 tablets by mouth daily.     metFORMIN (GLUCOPHAGE) 1000 MG tablet Take 1 tablet by mouth 2 (two) times daily.     sertraline (ZOLOFT) 100 MG tablet Take by mouth.      Allergies: Allergies  Allergen Reactions   Other     Tomato paste    Social History: Social History   Tobacco Use   Smoking status: Every Day    Current packs/day: 0.50    Types: Cigarettes   Smokeless tobacco: Never  Vaping Use   Vaping status: Never Used  Substance Use Topics   Alcohol use: Not Currently   Drug use: Yes    Types: Marijuana    Family History Family History  Problem Relation Age of Onset   Cancer Other    Hypertension Other     Review of Systems 10 systems were reviewed and are negative except as noted specifically in the HPI.  Objective   Vital signs in last 24 hours: BP (!) 177/89 (BP Location: Left Arm)   Pulse 75   Temp 98.2 F (36.8 C) (Oral)   Resp 17   SpO2 99%   Physical Exam General: NAD, A&O, resting, appropriate HEENT: [place]/AT, EOMI, MMM Pulmonary: Normal work of breathing Cardiovascular: HDS, adequate peripheral perfusion Abdomen: Soft, NTTP, obese  abdomen with extensive pannus GU: buried penis, Bilateral testicles descended and nontender.  Large indurated, fluctuant, and tense abscess noted on the right inferior portion of scrotum.  Moderately tender to palpation Extremities: warm and well perfused Neuro: Appropriate, no focal neurological deficits  Most Recent Labs: Lab Results  Component Value Date   WBC 7.9 06/19/2023   HGB 13.4 06/19/2023   HCT 39.5 06/19/2023   PLT 198 06/19/2023    Lab Results  Component Value Date   NA 131 (L) 06/19/2023   K 3.2 (L) 06/19/2023   CL 95 (L) 06/19/2023   CO2 27 06/19/2023   BUN 11 06/19/2023   CREATININE 0.63 06/19/2023   CALCIUM 8.9 06/19/2023    No results  found for: "INR", "APTT"   Urine Culture: @[id number] (laburin,org,[id number])@   IMAGING: CT PELVIS W CONTRAST  Result Date: 06/19/2023 CLINICAL DATA:  Soft tissue infection suspected, pelvis, xray done EXAM: CT PELVIS WITH CONTRAST TECHNIQUE: Multidetector CT imaging of the pelvis was performed using the standard protocol following the bolus administration of intravenous contrast. RADIATION DOSE REDUCTION: This exam was performed according to the departmental dose-optimization program which includes automated exposure control, adjustment of the mA and/or kV according to patient size and/or use of iterative reconstruction technique. CONTRAST:  OMNIPAQUE IOHEXOL 300 MG/ML  SOLN COMPARISON:  06/02/2013 FINDINGS: Urinary Tract:  No abnormality visualized. Bowel: No evidence of bowel obstruction or active bowel inflammation within the pelvis. Scattered sigmoid diverticula. Vascular/Lymphatic: No significant vascular abnormality. Mildly prominent bilateral inguinal and external iliac chain lymph nodes, likely reactive. No lymphadenopathy by size criteria. Reproductive:  Prostate gland is normal in appearance. Other:  No free air or free fluid within the pelvis. Musculoskeletal: No acute osseous abnormality. No suspicious bone lesion. Incidentally noted bone islands within the right iliac bone and right pubic. Inflammatory changes in the soft tissues of the perineum. There is a rim enhancing fluid and gas containing collection in the right perineum measuring 6.0 x 2.3 x 4.3 cm. Aside from this collection, there is no additional foci of gas within the soft tissues. Small bilateral hydroceles. IMPRESSION: 1. Inflammatory changes in the soft tissues of the perineum with a rim-enhancing fluid and gas containing collection in the right perineum measuring 6.0 x 2.3 x 4.3 cm, compatible with abscess. 2. Small bilateral hydroceles. 3. No acute intrapelvic abnormality. Electronically Signed   By: Duanne Guess  D.O.   On: 06/19/2023 12:57   US Scrotum  Result Date: 06/19/2023 CLINICAL DATA:  Right-sided abscess. EXAM: ULTRASOUND OF SCROTUM TECHNIQUE: Complete ultrasound examination of the testicles, epididymis, and other scrotal structures was performed. COMPARISON:  None Available. FINDINGS: Right testicle Measurements: 4.3 x 2.0 x 3.2 cm. No mass or microlithiasis visualized. Left testicle Measurements: 4.1 x 2.4 x 2.9 cm. No mass or microlithiasis visualized. Right epididymis:  5 mm epididymal cyst or spermatocele. Left epididymis:  Normal in size and appearance. Hydrocele: Small right-sided hydrocele. Small left hydrocele contains low level internal echoes and some septations, Varicocele:  None visualized. Measurements: 4.3 x 3.0 x 3.2 cm. No mass or microlithiasis visualized. IMPRESSION: 1. Small bilateral hydroceles, left greater than right. The left hydrocele contains low level internal echoes and some septations, potentially from prior infection/inflammation or hemorrhage. 2. 5 mm right epididymal cyst or spermatocele. 3. No testicular abnormality. Electronically Signed   By: Kennith Center M.D.   On: 06/19/2023 08:38    ------  Assessment:  37 y.o. male with past medical history of uncontrolled diabetes, hypertension, hidradenitis,  who presents to ED with concern for gas-forming scrotal abscess.  Recommend proceeding to the OR for surgical debridement of abscess given its size and concern for gas-forming infection.C  Recommendations: - Case discussed with OR front desk - will go later today - Agree with broad spectrum antibiotic coverage - Recommend admission for medicine - Please keep pt NPO    Thank you for this consult. Please contact the urology consult pager with any further questions/concerns.

## 2023-06-19 NOTE — Transfer of Care (Signed)
Immediate Anesthesia Transfer of Care Note  Patient: Andrew Gibson  Procedure(s) Performed: IRRIGATION AND DEBRIDEMENT ABSCESS (Scrotum)  Patient Location: PACU  Anesthesia Type:General  Level of Consciousness: awake, alert , oriented, and patient cooperative  Airway & Oxygen Therapy: Patient Spontanous Breathing and Patient connected to face mask oxygen  Post-op Assessment: Report given to RN, Post -op Vital signs reviewed and stable, and Patient moving all extremities X 4  Post vital signs: Reviewed and stable  Last Vitals:  Vitals Value Taken Time  BP    Temp    Pulse 77 06/19/23 1606  Resp 12 06/19/23 1606  SpO2 100 % 06/19/23 1606  Vitals shown include unfiled device data.  Last Pain:  Vitals:   06/19/23 1258  TempSrc: Oral  PainSc: 5          Complications: No notable events documented.

## 2023-06-19 NOTE — Progress Notes (Signed)
Pharmacy Antibiotic Note  Andrew Gibson is a 37 y.o. male who presented to the ED on 06/19/2023 with c/o right-sided scrotal pain and swelling.  Pharmacy has been consulted to dose vancomycin and zosyn for right scrotal hidradenitis with abscess.  -  11/2 pelvis CT: Inflammatory changes in the soft tissues of the perineum with a rim-enhancing fluid and gas containing collection in the right Perineum, compatible with abscess.  - pt reported ht = 67 inches, weight 135 kg   Plan: - zosyn 3.375 gm IV q8h (infuse over 4 hrs) - vancomycin 2000 mg IV x1, then 1500 mg IV q12h for est AUC 434 - daily scr while on vancomycin and zosyn  ___________________________________________________  Temp (24hrs), Avg:98.2 F (36.8 C), Min:97.6 F (36.4 C), Max:98.6 F (37 C)  Recent Labs  Lab 06/19/23 0827  WBC 7.9  CREATININE 0.63    CrCl cannot be calculated (Unknown ideal weight.).    Allergies  Allergen Reactions   Other Nausea And Vomiting and Rash    Tomato paste Black beans- fainting      Thank you for allowing pharmacy to be a part of this patient's care.  Lucia Gaskins 06/19/2023 2:11 PM

## 2023-06-19 NOTE — H&P (Signed)
History and Physical    Patient: Andrew Gibson UEA:[id number] DOB: [birth year] DOA: 06/19/2023 DOS: the patient was seen and examined on 06/19/2023 PCP: Avel Sensor, MD  Patient coming from: Home  Chief Complaint:  Chief Complaint  Patient presents with   Abscess   HPI: Andrew Gibson is a 37 y.o. male with medical history significant of type 2 diabetes, hidradenitis, hypertension, hypochondriac, depression, OCD, nephrolithiasis, history umbilical hernia who presented to the emergency department with complaints of having an abscess in his right groin area the first appendectomy here on Tuesday on the side where he had a previous small abscess that resolved spontaneously last year, but has grown significantly over the past 4 days associated with erythema and tenderness of the area. He denied fever, chills, rhinorrhea, sore throat, wheezing or hemoptysis.  No chest pain, palpitations, diaphoresis, PND, orthopnea or pitting edema of the lower extremities.  No abdominal pain, nausea, emesis, diarrhea, constipation, melena or hematochezia.  No flank pain, dysuria, frequency or hematuria.  No polyuria, polydipsia, polyphagia or blurred vision.   Lab work: CBC showed a white count 7.9, hemoglobin 13.4 g deciliter platelets 198.  BMP showed a sign 31, potassium 3.2, chloride 95 and CO2 27 mmol/L.  Glucose 331 mg/deciliter.  Renal function and calcium are normal.  Imaging: Scrotal ultrasound shows small bilateral hydroceles, left greater than right.  The left hydrocele contains low-level internal echoes and some septations, potentially from prior infection/inflammation or hemorrhage.  5 mm right epididymal cyst or spermatocele he.  No testicular abnormality.  CT pelvis with contrast showed inflammatory changes in the soft tissues of the perineum with a rim-enhancing fluid and gas containing collection in the right perineum measuring 6.0 x 2.3 x 4.3 cm, compatible with an abscess.  Small bilateral  hydrocele leads.  No acute intrapelvic abnormality.  ED course: Initial vital signs were temperature 97.6 F, pulse 80, respirations 17, BP 191/112 mmHg and O2 sat 100% on room air.  Patient received morphine 6 mg IVP x 1, a tablet of Percocet/5 325 mg, vancomycin and Zosyn IVPB.   Review of Systems: As mentioned in the history of present illness. All other systems reviewed and are negative. Past Medical History:  Diagnosis Date   Diabetes mellitus without complication (HCC)    Hydradenitis    Hypertension    Hypochondria    Kidney stone    OCD (obsessive compulsive disorder)    Past Surgical History:  Procedure Laterality Date   ANKLE SURGERY     HERNIA REPAIR     PILONIDAL CYST DRAINAGE     WISDOM TOOTH EXTRACTION     Social History:  reports that he has been smoking cigarettes. He has never used smokeless tobacco. He reports that he does not currently use alcohol. He reports current drug use. Drug: Marijuana.  Allergies  Allergen Reactions   Other Nausea And Vomiting and Rash    Tomato paste Black beans- fainting     Family History  Problem Relation Age of Onset   Cancer Other    Hypertension Other     Prior to Admission medications   Medication Sig Start Date End Date Taking? Authorizing Provider  atenolol (TENORMIN) 50 MG tablet Take 1 tablet by mouth daily. 11/22/21  Yes [provider]  lisinopril-hydrochlorothiazide (ZESTORETIC) 20-12.5 MG tablet Take 2 tablets by mouth daily. 11/22/21  Yes [provider]  metFORMIN (GLUCOPHAGE) 1000 MG tablet Take 1 tablet by mouth 2 (two) times daily. 03/25/21  Yes [provider]  sertraline (ZOLOFT) 100 MG tablet Take 200 mg by mouth daily. 11/22/21  Yes [provider]  doxycycline (VIBRAMYCIN) 100 MG capsule Take 1 capsule (100 mg total) by mouth 2 (two) times daily. Patient not taking: Reported on 06/19/2023 04/26/23   Eustace Moore, MD  ibuprofen (ADVIL) 800 MG tablet Take 1 tablet (800 mg  total) by mouth 3 (three) times daily. Patient not taking: Reported on 06/19/2023 04/26/23   Eustace Moore, MD    Physical Exam: Vitals:   06/19/23 0345 06/19/23 0707 06/19/23 0834 06/19/23 1258  BP: (!) 169/98 (!) 183/91 (!) 155/82 (!) 177/89  Pulse: 86 88 72 75  Resp: 19 10 11 17   Temp: 98.1 F (36.7 C) 98.5 F (36.9 C) 98.6 F (37 C) 98.2 F (36.8 C)  TempSrc:  Oral  Oral  SpO2: 99% 97% 99% 99%   Physical Exam Vitals and nursing note reviewed.  Constitutional:      General: He is awake. He is not in acute distress.    Appearance: Normal appearance. He is morbidly obese. He is ill-appearing.  HENT:     Head: Normocephalic.     Nose: No rhinorrhea.     Mouth/Throat:     Mouth: Mucous membranes are moist.  Eyes:     General: No scleral icterus.    Pupils: Pupils are equal, round, and reactive to light.  Neck:     Vascular: No JVD.  Cardiovascular:     Rate and Rhythm: Normal rate and regular rhythm.     Heart sounds: S1 normal and S2 normal.  Pulmonary:     Effort: Pulmonary effort is normal.     Breath sounds: Normal breath sounds. No wheezing, rhonchi or rales.  Abdominal:     General: Bowel sounds are normal. There is no distension.     Palpations: Abdomen is soft.     Tenderness: There is abdominal tenderness.  Genitourinary:    Testes:        Right: Tenderness and swelling present.     Comments: Erythema in right perineal and scrotal area. Musculoskeletal:     Cervical back: Neck supple.     Right lower leg: No edema.     Left lower leg: No edema.  Skin:    General: Skin is warm and dry.  Neurological:     General: No focal deficit present.     Mental Status: He is alert and oriented to person, place, and time.  Psychiatric:        Mood and Affect: Mood normal.        Behavior: Behavior normal. Behavior is cooperative.     Data Reviewed:  Results are pending, will review when available.  Assessment and Plan: Principal Problem:   Perineal  abscess Admit to PCU/inpatient. Continue IV fluids. Continue Zosyn per pharmacy. Analgesics as needed.   Antiemetics as needed. Continue vancomycin per pharmacy. Follow-up blood culture and sensitivity Follow CBC and CMP in a.m.  Active Problems:   Hypokalemia Secondary to diuretic use Potassium supplement ordered. Follow potassium level in the morning.    Hyponatremia Minimal. Secondary to HCTZ use. Follow sodium level in AM. Discontinue HCTZ if it worsens.    Hypertension Continue atenolol 1 tablet p.o. daily. Continue Zestoretic 20-12.5 mg p.o. daily. Will add a potassium supplementation.    MDD (major depressive disorder)   OCD (obsessive compulsive disorder) Continue sertraline 200 mg p.o. daily.    Type 2 diabetes mellitus with hyperglycemia (HCC)  Carbohydrate modified diet. Continue metformin 1000 mg p.o. twice daily. CBG monitoring with RI SS. Check hemoglobin A1c.    Class 3 obesity  Current BMI  kg/m. Lifestyle modifications. Follow-up with closely PCP and/or bariatric clinic.      Advance Care Planning:   Code Status: Full Code   Consults: Urology Di Kindle, MD).  Family Communication:   Severity of Illness: The appropriate patient status for this patient is INPATIENT. Inpatient status is judged to be reasonable and necessary in order to provide the required intensity of service to ensure the patient's safety. The patient's presenting symptoms, physical exam findings, and initial radiographic and laboratory data in the context of their chronic comorbidities is felt to place them at high risk for further clinical deterioration. Furthermore, it is not anticipated that the patient will be medically stable for discharge from the hospital within 2 midnights of admission.   * I certify that at the point of admission it is my clinical judgment that the patient will require inpatient hospital care spanning beyond 2 midnights from the point of  admission due to high intensity of service, high risk for further deterioration and high frequency of surveillance required.*  Author: Bobette Mo, MD 06/19/2023 1:50 PM  For on call review [email or web address].   This document was prepared using Dragon voice recognition software and may contain some unintended transcription errors.

## 2023-06-19 NOTE — Anesthesia Preprocedure Evaluation (Addendum)
Anesthesia Evaluation  Patient identified by MRN, date of birth, ID band Patient awake    Reviewed: Allergy & Precautions, H&P , NPO status , Patient's Chart, lab work & pertinent test results  Airway Mallampati: II  TM Distance: >3 FB Neck ROM: Full    Dental no notable dental hx. (+) Teeth Intact, Dental Advisory Given   Pulmonary neg pulmonary ROS, Current Smoker   Pulmonary exam normal breath sounds clear to auscultation       Cardiovascular Exercise Tolerance: Good hypertension, Pt. on medications and Pt. on home beta blockers negative cardio ROS Normal cardiovascular exam Rhythm:Regular Rate:Normal     Neuro/Psych  PSYCHIATRIC DISORDERS  Depression    negative neurological ROS  negative psych ROS   GI/Hepatic negative GI ROS, Neg liver ROS,,,  Endo/Other  negative endocrine ROSdiabetes, Oral Hypoglycemic Agents    Renal/GU Renal diseasenegative Renal ROS  negative genitourinary   Musculoskeletal negative musculoskeletal ROS (+)    Abdominal   Peds negative pediatric ROS (+)  Hematology negative hematology ROS (+)   Anesthesia Other Findings   Reproductive/Obstetrics negative OB ROS                             Anesthesia Physical Anesthesia Plan  ASA: 3 and emergent  Anesthesia Plan: General   Post-op Pain Management: Tylenol PO (pre-op)* and Celebrex PO (pre-op)*   Induction: Intravenous  PONV Risk Score and Plan: 1 and Ondansetron, Dexamethasone and Treatment may vary due to age or medical condition  Airway Management Planned: LMA and Oral ETT  Additional Equipment: None  Intra-op Plan:   Post-operative Plan: Extubation in OR  Informed Consent: I have reviewed the patients History and Physical, chart, labs and discussed the procedure including the risks, benefits and alternatives for the proposed anesthesia with the patient or authorized representative who has  indicated his/her understanding and acceptance.       Plan Discussed with: Anesthesiologist and CRNA  Anesthesia Plan Comments: (  )        Anesthesia Quick Evaluation

## 2023-06-19 NOTE — Consult Note (Signed)
CC: right scrotal pain  Requesting provider: dr Rhunette Croft  HPI: Andrew Gibson is an 37 y.o. male with severe obesity, DM2, HTN, history of hidradenitis presents for worsening right scrotal area pain and swelling.  He states the discomfort started on Monday.  It got so severe that he could not tolerate anymore so he came to the emergency room.  He reports that he had a similar event in that area but it resolved with antibiotics about a year ago.Marland Kitchen  He has not been on any antibiotics for this episode.  Sugars have been running high.  He states that he has had problems with hidradenitis in his arms and chest area.  He had 1 episode on his face about 2 months ago.  No nausea, vomiting, fever or chills.  Past Medical History:  Diagnosis Date   Diabetes mellitus without complication (HCC)    Hydradenitis    Hypertension    Hypochondria    Kidney stone    OCD (obsessive compulsive disorder)     Past Surgical History:  Procedure Laterality Date   ANKLE SURGERY     HERNIA REPAIR     PILONIDAL CYST DRAINAGE     WISDOM TOOTH EXTRACTION      Family History  Problem Relation Age of Onset   Cancer Other    Hypertension Other     Social:  reports that he has been smoking cigarettes. He has never used smokeless tobacco. He reports that he does not currently use alcohol. He reports current drug use. Drug: Marijuana.  Allergies:  Allergies  Allergen Reactions   Other     Tomato paste    Medications: I have reviewed the patient's current medications.   ROS - all of the below systems have been reviewed with the patient and positives are indicated with bold text General: chills, fever or night sweats Eyes: blurry vision or double vision ENT: epistaxis or sore throat Allergy/Immunology: itchy/watery eyes or nasal congestion Hematologic/Lymphatic: bleeding problems, blood clots or swollen lymph nodes Endocrine: temperature intolerance or unexpected weight changes Breast: new or changing  breast lumps or nipple discharge Resp: cough, shortness of breath, or wheezing CV: chest pain or dyspnea on exertion GI: as per HPI GU: dysuria, trouble voiding, or hematuria MSK: joint pain or joint stiffness Neuro: TIA or stroke symptoms Derm: pruritus and skin lesion changes Psych: anxiety and depression  PE Blood pressure (!) 155/82, pulse 72, temperature 98.6 F (37 C), resp. rate 11, SpO2 99%. Constitutional: NAD; conversant; no deformities; severe obesity Eyes: Moist conjunctiva; no lid lag; anicteric; PERRL Neck: Trachea midline; no thyromegaly Lungs: Normal respiratory effort; no tactile fremitus CV: RRR; no palpable thrills; no pitting edema GI: Abd soft, nt, nd; no palpable hepatosplenomegaly MSK: Normal gait; no clubbing/cyanosis Psychiatric: Appropriate affect; alert and oriented x3 Lymphatic: No palpable cervical or axillary lymphadenopathy Skin:Right lateral scrotal - appears to have well circumscribed area of swelling, tenderness, ?fluctuance in one area, some chronic skin thickening as well, no crepitus, +induration  Results for orders placed or performed during the hospital encounter of 06/19/23 (from the past 48 hour(s))  CBC with Differential     Status: None   Collection Time: 06/19/23  8:27 AM  Result Value Ref Range   WBC 7.9 4.0 - 10.5 K/uL   RBC 4.84 4.22 - 5.81 MIL/uL   Hemoglobin 13.4 13.0 - 17.0 g/dL   HCT 16.1 09.6 - 04.5 %   MCV 81.6 80.0 - 100.0 fL   MCH 27.7 26.0 -  34.0 pg   MCHC 33.9 30.0 - 36.0 g/dL   RDW 84.1 66.0 - 63.0 %   Platelets 198 150 - 400 K/uL   nRBC 0.0 0.0 - 0.2 %   Neutrophils Relative % 77 %   Neutro Abs 6.1 1.7 - 7.7 K/uL   Lymphocytes Relative 15 %   Lymphs Abs 1.2 0.7 - 4.0 K/uL   Monocytes Relative 6 %   Monocytes Absolute 0.5 0.1 - 1.0 K/uL   Eosinophils Relative 2 %   Eosinophils Absolute 0.1 0.0 - 0.5 K/uL   Basophils Relative 0 %   Basophils Absolute 0.0 0.0 - 0.1 K/uL   Immature Granulocytes 0 %   Abs Immature  Granulocytes 0.02 0.00 - 0.07 K/uL    Comment: Performed at [facility], [address], [place], [place] [address]  Basic metabolic panel     Status: Abnormal   Collection Time: 06/19/23  8:27 AM  Result Value Ref Range   Sodium 131 (L) 135 - 145 mmol/L   Potassium 3.2 (L) 3.5 - 5.1 mmol/L   Chloride 95 (L) 98 - 111 mmol/L   CO2 27 22 - 32 mmol/L   Glucose, Bld 331 (H) 70 - 99 mg/dL    Comment: Glucose reference range applies only to samples taken after fasting for at least 8 hours.   BUN 11 6 - 20 mg/dL   Creatinine, Ser 9.32 0.61 - 1.24 mg/dL   Calcium 8.9 8.9 - 35.5 mg/dL   GFR, Estimated >73 >22 mL/min    Comment: (NOTE) Calculated using the CKD-EPI Creatinine Equation (2021)    Anion gap 9 5 - 15    Comment: Performed at [facility], [address], [place], [place] [address]  POC CBG, ED     Status: Abnormal   Collection Time: 06/19/23  8:33 AM  Result Value Ref Range   Glucose-Capillary 295 (H) 70 - 99 mg/dL    Comment: Glucose reference range applies only to samples taken after fasting for at least 8 hours.    US Scrotum  Result Date: 06/19/2023 CLINICAL DATA:  Right-sided abscess. EXAM: ULTRASOUND OF SCROTUM TECHNIQUE: Complete ultrasound examination of the testicles, epididymis, and other scrotal structures was performed. COMPARISON:  None Available. FINDINGS: Right testicle Measurements: 4.3 x 2.0 x 3.2 cm. No mass or microlithiasis visualized. Left testicle Measurements: 4.1 x 2.4 x 2.9 cm. No mass or microlithiasis visualized. Right epididymis:  5 mm epididymal cyst or spermatocele. Left epididymis:  Normal in size and appearance. Hydrocele: Small right-sided hydrocele. Small left hydrocele contains low level internal echoes and some septations, Varicocele:  None visualized. Measurements: 4.3 x 3.0 x 3.2 cm. No mass or microlithiasis visualized. IMPRESSION: 1. Small bilateral hydroceles, left greater than right. The left hydrocele  contains low level internal echoes and some septations, potentially from prior infection/inflammation or hemorrhage. 2. 5 mm right epididymal cyst or spermatocele. 3. No testicular abnormality. Electronically Signed   By: Kennith Center M.D.   On: 06/19/2023 08:38    Imaging: reviewed  A/P: Andrew Gibson is an 37 y.o. male with  Severe obesity Uncontrolled DM2 HTN Mild hyponatremia Hypokalemia Right scrotal swelling/pain/induration H/o hidradenitis  In the setting of hidradenitis difficult to tell if there is an underlying abscess.  There is some localized swelling in this area, chronic skin thickening, and perhaps an area of fluctuance however his scrotal ultrasound was negative.  I think he would be best evaluated by urology  1 option  that the ER could entertain would be to aspirate the area to see if there is any purulence.  Other option would be to get a CT pelvis to see if there is a localized fluid collection better identified on imaging.  Would not recommend just blind incision and drainage just based on physical exam alone given his comorbidities and potential wound healing issues since if this is just severe indurated tissue from hidradenitis which is generally better managed with antibiotics  Would recommend antibiotics at least  Has no fever, tachycardia or leukocytosis which is all reassuring  Data reviewed- ED notes, labs, vitals, ED note 04/26/23, imaging  Mary Sella. Andrey Campanile, MD, FACS General, Bariatric, & Minimally Invasive Surgery [facility]

## 2023-06-19 NOTE — ED Triage Notes (Signed)
Patient reports abscess in his groin area "the size of a kiwi". Patient states it started getting worse on Tuesday. Patient tried to treat it at home with antibiotic cream.

## 2023-06-19 NOTE — Op Note (Signed)
Operative Note  Preoperative diagnosis:  1.  Scrotal abscess  Postoperative diagnosis: 1.  Scrotal abscess  Procedure(s): 1.  Incision and debridement of scrotal abscess  Surgeon: Dr. Di Kindle  Assistants: Dr. Zettie Pho  Anesthesia:  General  Complications:  None  EBL: Minimal  Specimens: 1.  Abscess culture  Drains/Catheters: 1.  None  Intraoperative findings:   Right lateral scrotal abscess with purulent like material expelled, approximately 30 to 40 cc in total Wet-to-dry dressings placed in abscess cavity after debridement No necrotic tissue was visualized, tissue beneath the abscess was indurated and inflamed  Indication: This is a 37 y.o. male with history significant for uncontrolled diabetes, hypertension, hidradenitis, prior nephrolithiasis who presents with scrotal abscess that has been present for 5 days.  Given concern for gas-forming organisms along with the size of the abscess and any above-mentioned risk factors, decision was made to proceed to the operating room for incision and drainage and possible debridement.  Description of procedure:  The patient was taken to the operating theater.  General anesthesia was induced by the anesthesia team, and the patient was placed gently in dorsal lithotomy position with appropriate pressure points padded.  The scrotum was prepped and draped in the standard surgical fashion.  We turned our attention to the right aspect of the scrotum where there was noted fluctuance and erythema.  After ensuring that the right and left testicles were both not involved, a vertical incision was made into the scrotal abscess.  Immediate high-pressure contents were expelled that were purulent and had melted chocolate like consistency.  A probe was inserted into the abscess, and swabs were taken.  The incision was further opened.  Multiple loculations were present, these were broken using finger manipulation and aggressive suctioning.   After drainage of abscess, no further noticeable fluid pockets were utilized.  Wet Kerlix was used to pack the abscess.  A jockstrap was applied.  We ended our case, the patient was awoken and taken to PACU in stable condition.  Plan:    Continue broad spectrum abx Follow-up intra-op culture swabs Will plan for BID dressing changes, first one tomorrow AM by Urology team, and then by patient and/or nursing staff Rest of care per medicine team

## 2023-06-19 NOTE — H&P (View-Only) (Signed)
Urology Consult Note   Requesting Attending Physician:  Derwood Kaplan, MD Service Providing Consult: Urology  Consulting Attending: Pete Glatter   Reason for Consult:  Scrotal Abscess  HPI: Andrew Gibson is seen in consultation for reasons noted above at the request of Derwood Kaplan, MD for evaluation of scrotal abscess  This is a 37 y.o. male with history significant for uncontrolled diabetes, hypertension, hidradenitis, prior nephrolithiasis who presents with scrotal abscess that has been present for 5 days  He states that he first noticed a lump on the side of his right scrotum on Monday (5 days ago).  He states that it is slowly progressed in size, and has become more painful.  The pain was unbearable over the last 24 hours, so he decided present to emergency department  Reassuringly, his vital signs are stable.  He has no white count, and he has no reported fevers or subjective chills at home.  Scrotal ultrasound did not pick up the abscess, but CT abdomen and pelvis was obtained showing at scrotal abscess and right inferior scrotum that spans at least 6 cm.  There is also gas contained in his abscess, unclear if there is no tracking.   Past Medical History: Past Medical History:  Diagnosis Date   Diabetes mellitus without complication (HCC)    Hydradenitis    Hypertension    Hypochondria    Kidney stone    OCD (obsessive compulsive disorder)     Past Surgical History:  Past Surgical History:  Procedure Laterality Date   ANKLE SURGERY     HERNIA REPAIR     PILONIDAL CYST DRAINAGE     WISDOM TOOTH EXTRACTION      Medication: No current facility-administered medications for this encounter.   Current Outpatient Medications  Medication Sig Dispense Refill   atenolol (TENORMIN) 50 MG tablet Take 1 tablet by mouth daily.     doxycycline (VIBRAMYCIN) 100 MG capsule Take 1 capsule (100 mg total) by mouth 2 (two) times daily. 14 capsule 0   ibuprofen (ADVIL) 800 MG  tablet Take 1 tablet (800 mg total) by mouth 3 (three) times daily. 21 tablet 0   lisinopril-hydrochlorothiazide (ZESTORETIC) 20-12.5 MG tablet Take 2 tablets by mouth daily.     metFORMIN (GLUCOPHAGE) 1000 MG tablet Take 1 tablet by mouth 2 (two) times daily.     sertraline (ZOLOFT) 100 MG tablet Take by mouth.      Allergies: Allergies  Allergen Reactions   Other     Tomato paste    Social History: Social History   Tobacco Use   Smoking status: Every Day    Current packs/day: 0.50    Types: Cigarettes   Smokeless tobacco: Never  Vaping Use   Vaping status: Never Used  Substance Use Topics   Alcohol use: Not Currently   Drug use: Yes    Types: Marijuana    Family History Family History  Problem Relation Age of Onset   Cancer Other    Hypertension Other     Review of Systems 10 systems were reviewed and are negative except as noted specifically in the HPI.  Objective   Vital signs in last 24 hours: BP (!) 177/89 (BP Location: Left Arm)   Pulse 75   Temp 98.2 F (36.8 C) (Oral)   Resp 17   SpO2 99%   Physical Exam General: NAD, A&O, resting, appropriate HEENT: [place]/AT, EOMI, MMM Pulmonary: Normal work of breathing Cardiovascular: HDS, adequate peripheral perfusion Abdomen: Soft, NTTP, obese  abdomen with extensive pannus GU: buried penis, Bilateral testicles descended and nontender.  Large indurated, fluctuant, and tense abscess noted on the right inferior portion of scrotum.  Moderately tender to palpation Extremities: warm and well perfused Neuro: Appropriate, no focal neurological deficits  Most Recent Labs: Lab Results  Component Value Date   WBC 7.9 06/19/2023   HGB 13.4 06/19/2023   HCT 39.5 06/19/2023   PLT 198 06/19/2023    Lab Results  Component Value Date   NA 131 (L) 06/19/2023   K 3.2 (L) 06/19/2023   CL 95 (L) 06/19/2023   CO2 27 06/19/2023   BUN 11 06/19/2023   CREATININE 0.63 06/19/2023   CALCIUM 8.9 06/19/2023    No results  found for: "INR", "APTT"   Urine Culture: @[id number] (laburin,org,[id number])@   IMAGING: CT PELVIS W CONTRAST  Result Date: 06/19/2023 CLINICAL DATA:  Soft tissue infection suspected, pelvis, xray done EXAM: CT PELVIS WITH CONTRAST TECHNIQUE: Multidetector CT imaging of the pelvis was performed using the standard protocol following the bolus administration of intravenous contrast. RADIATION DOSE REDUCTION: This exam was performed according to the departmental dose-optimization program which includes automated exposure control, adjustment of the mA and/or kV according to patient size and/or use of iterative reconstruction technique. CONTRAST:  OMNIPAQUE IOHEXOL 300 MG/ML  SOLN COMPARISON:  06/02/2013 FINDINGS: Urinary Tract:  No abnormality visualized. Bowel: No evidence of bowel obstruction or active bowel inflammation within the pelvis. Scattered sigmoid diverticula. Vascular/Lymphatic: No significant vascular abnormality. Mildly prominent bilateral inguinal and external iliac chain lymph nodes, likely reactive. No lymphadenopathy by size criteria. Reproductive:  Prostate gland is normal in appearance. Other:  No free air or free fluid within the pelvis. Musculoskeletal: No acute osseous abnormality. No suspicious bone lesion. Incidentally noted bone islands within the right iliac bone and right pubic. Inflammatory changes in the soft tissues of the perineum. There is a rim enhancing fluid and gas containing collection in the right perineum measuring 6.0 x 2.3 x 4.3 cm. Aside from this collection, there is no additional foci of gas within the soft tissues. Small bilateral hydroceles. IMPRESSION: 1. Inflammatory changes in the soft tissues of the perineum with a rim-enhancing fluid and gas containing collection in the right perineum measuring 6.0 x 2.3 x 4.3 cm, compatible with abscess. 2. Small bilateral hydroceles. 3. No acute intrapelvic abnormality. Electronically Signed   By: Duanne Guess  D.O.   On: 06/19/2023 12:57   US Scrotum  Result Date: 06/19/2023 CLINICAL DATA:  Right-sided abscess. EXAM: ULTRASOUND OF SCROTUM TECHNIQUE: Complete ultrasound examination of the testicles, epididymis, and other scrotal structures was performed. COMPARISON:  None Available. FINDINGS: Right testicle Measurements: 4.3 x 2.0 x 3.2 cm. No mass or microlithiasis visualized. Left testicle Measurements: 4.1 x 2.4 x 2.9 cm. No mass or microlithiasis visualized. Right epididymis:  5 mm epididymal cyst or spermatocele. Left epididymis:  Normal in size and appearance. Hydrocele: Small right-sided hydrocele. Small left hydrocele contains low level internal echoes and some septations, Varicocele:  None visualized. Measurements: 4.3 x 3.0 x 3.2 cm. No mass or microlithiasis visualized. IMPRESSION: 1. Small bilateral hydroceles, left greater than right. The left hydrocele contains low level internal echoes and some septations, potentially from prior infection/inflammation or hemorrhage. 2. 5 mm right epididymal cyst or spermatocele. 3. No testicular abnormality. Electronically Signed   By: Kennith Center M.D.   On: 06/19/2023 08:38    ------  Assessment:  37 y.o. male with past medical history of uncontrolled diabetes, hypertension, hidradenitis,  who presents to ED with concern for gas-forming scrotal abscess.  Recommend proceeding to the OR for surgical debridement of abscess given its size and concern for gas-forming infection.C  Recommendations: - Case discussed with OR front desk - will go later today - Agree with broad spectrum antibiotic coverage - Recommend admission for medicine - Please keep pt NPO    Thank you for this consult. Please contact the urology consult pager with any further questions/concerns.

## 2023-06-20 DIAGNOSIS — L02215 Cutaneous abscess of perineum: Secondary | ICD-10-CM

## 2023-06-20 DIAGNOSIS — N492 Inflammatory disorders of scrotum: Principal | ICD-10-CM

## 2023-06-20 LAB — COMPREHENSIVE METABOLIC PANEL
ALT: 26 U/L (ref 0–44)
AST: 26 U/L (ref 15–41)
Albumin: 3.4 g/dL — ABNORMAL LOW (ref 3.5–5.0)
Alkaline Phosphatase: 94 U/L (ref 38–126)
Anion gap: 9 (ref 5–15)
BUN: 16 mg/dL (ref 6–20)
CO2: 23 mmol/L (ref 22–32)
Calcium: 8.1 mg/dL — ABNORMAL LOW (ref 8.9–10.3)
Chloride: 104 mmol/L (ref 98–111)
Creatinine, Ser: 0.64 mg/dL (ref 0.61–1.24)
GFR, Estimated: >60 mL/min (ref >60)
Glucose, Bld: 288 mg/dL — ABNORMAL HIGH (ref 70–99)
Potassium: 4.5 mmol/L (ref 3.5–5.1)
Sodium: 136 mmol/L (ref 135–145)
Total Bilirubin: 1 mg/dL (ref 0.3–1.2)
Total Protein: 7.2 g/dL (ref 6.5–8.1)

## 2023-06-20 LAB — PHOSPHORUS: Phosphorus: 3 mg/dL (ref 2.5–4.6)

## 2023-06-20 LAB — CBC
HCT: 40.8 % (ref 39.0–52.0)
Hemoglobin: 13.5 g/dL (ref 13.0–17.0)
MCH: 27.6 pg (ref 26.0–34.0)
MCHC: 33.1 g/dL (ref 30.0–36.0)
MCV: 83.3 fL (ref 80.0–100.0)
Platelets: 198 10*3/uL (ref 150–400)
RBC: 4.9 MIL/uL (ref 4.22–5.81)
RDW: 14.4 % (ref 11.5–15.5)
WBC: 9.6 10*3/uL (ref 4.0–10.5)
nRBC: 0.2 % (ref 0.0–0.2)

## 2023-06-20 LAB — GLUCOSE, CAPILLARY
Glucose-Capillary: 256 mg/dL — ABNORMAL HIGH (ref 70–99)
Glucose-Capillary: 300 mg/dL — ABNORMAL HIGH (ref 70–99)
Glucose-Capillary: 334 mg/dL — ABNORMAL HIGH (ref 70–99)
Glucose-Capillary: 341 mg/dL — ABNORMAL HIGH (ref 70–99)

## 2023-06-20 LAB — MAGNESIUM: Magnesium: 2.3 mg/dL (ref 1.7–2.4)

## 2023-06-20 LAB — HEMOGLOBIN A1C
Hgb A1c MFr Bld: 11.9 % — ABNORMAL HIGH (ref 4.8–5.6)
Mean Plasma Glucose: 294.83 mg/dL

## 2023-06-20 LAB — HIV ANTIBODY (ROUTINE TESTING W REFLEX): HIV Screen 4th Generation wRfx: NONREACTIVE

## 2023-06-20 MED ORDER — SULFAMETHOXAZOLE-TRIMETHOPRIM 800-160 MG PO TABS: 1.0000 | ORAL_TABLET | Freq: Two times a day (BID) | ORAL | 0 refills | Status: AC

## 2023-06-20 MED ORDER — INSULIN GLARGINE-YFGN 100 UNIT/ML ~~LOC~~ SOLN
10.0000 [IU] | Freq: Two times a day (BID) | SUBCUTANEOUS | Status: DC
  Administered 2023-06-20 – 2023-06-21 (×3): 10 [IU] via SUBCUTANEOUS
  Filled 2023-06-20 (×4): qty 0.1

## 2023-06-20 MED ORDER — OXYCODONE HCL 5 MG PO TABS: 5.0000 mg | ORAL_TABLET | ORAL | 0 refills | Status: AC | PRN

## 2023-06-20 MED ORDER — HYDROMORPHONE HCL 1 MG/ML IJ SOLN
0.5000 mg | Freq: Once | INTRAMUSCULAR | Status: AC
  Administered 2023-06-20: 0.5 mg via INTRAVENOUS

## 2023-06-20 NOTE — Plan of Care (Signed)
  Problem: Clinical Measurements: Goal: Ability to avoid or minimize complications of infection will improve Outcome: Progressing   Problem: Skin Integrity: Goal: Skin integrity will improve Outcome: Progressing   Problem: Education: Goal: Knowledge of General Education information will improve Description: Including pain rating scale, medication(s)/side effects and non-pharmacologic comfort measures Outcome: Progressing   Problem: Health Behavior/Discharge Planning: Goal: Ability to manage health-related needs will improve Outcome: Progressing   Problem: Clinical Measurements: Goal: Ability to maintain clinical measurements within normal limits will improve Outcome: Progressing Goal: Will remain free from infection Outcome: Progressing Goal: Diagnostic test results will improve Outcome: Progressing Goal: Respiratory complications will improve Outcome: Progressing Goal: Cardiovascular complication will be avoided Outcome: Progressing   Problem: Activity: Goal: Risk for activity intolerance will decrease Outcome: Progressing   Problem: Nutrition: Goal: Adequate nutrition will be maintained Outcome: Progressing   Problem: Coping: Goal: Level of anxiety will decrease Outcome: Progressing   Problem: Elimination: Goal: Will not experience complications related to bowel motility Outcome: Progressing Goal: Will not experience complications related to urinary retention Outcome: Progressing   Problem: Pain Management: Goal: General experience of comfort will improve Outcome: Progressing   Problem: Safety: Goal: Ability to remain free from injury will improve Outcome: Progressing   Problem: Skin Integrity: Goal: Risk for impaired skin integrity will decrease Outcome: Progressing   Problem: Education: Goal: Ability to describe self-care measures that may prevent or decrease complications (Diabetes Survival Skills Education) will improve Outcome: Progressing Goal:  Individualized Educational Video(s) Outcome: Progressing   Problem: Coping: Goal: Ability to adjust to condition or change in health will improve Outcome: Progressing   Problem: Fluid Volume: Goal: Ability to maintain a balanced intake and output will improve Outcome: Progressing   Problem: Health Behavior/Discharge Planning: Goal: Ability to identify and utilize available resources and services will improve Outcome: Progressing Goal: Ability to manage health-related needs will improve Outcome: Progressing   Problem: Metabolic: Goal: Ability to maintain appropriate glucose levels will improve Outcome: Progressing   Problem: Nutritional: Goal: Maintenance of adequate nutrition will improve Outcome: Progressing Goal: Progress toward achieving an optimal weight will improve Outcome: Progressing   Problem: Skin Integrity: Goal: Risk for impaired skin integrity will decrease Outcome: Progressing   Problem: Tissue Perfusion: Goal: Adequacy of tissue perfusion will improve Outcome: Progressing

## 2023-06-20 NOTE — Anesthesia Postprocedure Evaluation (Signed)
Anesthesia Post Note  Patient: Andrew Gibson  Procedure(s) Performed: IRRIGATION AND DEBRIDEMENT ABSCESS (Scrotum)     Patient location during evaluation: PACU Anesthesia Type: General Level of consciousness: awake and alert Pain management: pain level controlled Vital Signs Assessment: post-procedure vital signs reviewed and stable Respiratory status: spontaneous breathing, nonlabored ventilation, respiratory function stable and patient connected to nasal cannula oxygen Cardiovascular status: blood pressure returned to baseline and stable Postop Assessment: no apparent nausea or vomiting Anesthetic complications: no   No notable events documented.  Last Vitals:  Vitals:   06/20/23 0142 06/20/23 0618  BP: (!) 125/59 (!) 169/91  Pulse: 64 66  Resp: 16 20  Temp: 36.5 C 36.7 C  SpO2: 93% 97%    Last Pain:  Vitals:   06/20/23 0618  TempSrc: Oral  PainSc:                  Harper Vandervoort

## 2023-06-20 NOTE — Discharge Instructions (Signed)
Wet to dry dressing changes twice daily

## 2023-06-20 NOTE — TOC Initial Note (Signed)
Transition of Care [facility]) - Initial/Assessment Note    Patient Details  Name: Andrew Gibson MRN: [id number] Date of Birth: 14-Mar-1986  Transition of Care [facility]) CM/SW Contact:    Adrian Prows, RN Phone Number: 06/20/2023, 2:12 PM  Clinical Narrative:                 Sherron Monday w/ pt and wife Kearney Lions in room; pt says she is from home and plans to return at d/c; he has transportation; pt denies SDOH risks; he has a CPAP; pt says he does not have HH services or home oxygen; no TOC needs; signing off; please place consult if needed.  Expected Discharge Plan: Home/Self Care Barriers to Discharge: Continued Medical Work up   Patient Goals and CMS Choice Patient states their goals for this hospitalization and ongoing recovery are:: home          Expected Discharge Plan and Services   Discharge Planning Services: CM Consult   Living arrangements for the past 2 months: Single Family Home                                      Prior Living Arrangements/Services Living arrangements for the past 2 months: Single Family Home Lives with:: Spouse Patient language and need for interpreter reviewed:: Yes Do you feel safe going back to the place where you live?: Yes      Need for Family Participation in Patient Care: Yes (Comment) Care giver support system in place?: Yes (comment) Current home services: DME (cpap) Criminal Activity/Legal Involvement Pertinent to Current Situation/Hospitalization: No - Comment as needed  Activities of Daily Living   ADL Screening (condition at time of admission) Independently performs ADLs?: Yes (appropriate for developmental age) Is the patient deaf or have difficulty hearing?: No Does the patient have difficulty seeing, even when wearing glasses/contacts?: No Does the patient have difficulty concentrating, remembering, or making decisions?: No  Permission Sought/Granted Permission sought to share information with : Case Manager Permission  granted to share information with : Yes, Verbal Permission Granted  Share Information with NAME: Case Manager     Permission granted to share info w Relationship: Inioluwa Baris (spouse) [phone]     Emotional Assessment Appearance:: Appears stated age Attitude/Demeanor/Rapport: Gracious Affect (typically observed): Accepting Orientation: : Oriented to Self, Oriented to Place, Oriented to  Time, Oriented to Situation Alcohol / Substance Use: Not Applicable Psych Involvement: No (comment)  Admission diagnosis:  Scrotal abscess [N49.2] Perineal abscess [L02.215] Patient Active Problem List   Diagnosis Date Noted   Scrotal abscess 06/20/2023   Perineal abscess 06/19/2023   Type 2 diabetes mellitus with hyperglycemia (HCC) 06/19/2023   Class 3 obesity 06/19/2023   Hyponatremia 06/19/2023   Hypokalemia 06/19/2023   Hypertension 09/11/2020   OCD (obsessive compulsive disorder) 09/22/2019   MDD (major depressive disorder) 09/12/2018   Class 3 severe obesity due to excess calories with serious comorbidity and body mass index (BMI) of 50.0 to 59.9 in adult [facility]) 07/05/2014   Umbilical hernia without obstruction or gangrene 07/05/2014   PCP:  Avel Sensor, MD Pharmacy:   [facility] [phone] [place] Otto, [place] - [address] [place] [address] Phone: [phone] Fax: [phone]  [id number] [address] ([address]),  - [address] [place] (SE) [place] [address] Phone: [phone] Fax: [phone]  [facility] [phone] -  [place]ette Otto, [place] - [address] [place] [address] Phone: [phone] Fax: [phone]     Social Determinants of Health (SDOH) Social History: SDOH Screenings   Food Insecurity: No Food Insecurity (06/20/2023)  Housing: Low Risk  (06/20/2023)  Transportation Needs: No Transportation Needs (06/20/2023)  Utilities: Not At Risk (06/20/2023)   Financial Resource Strain: Low Risk  (06/09/2021)   Received from [facility]  Physical Activity: Sufficiently Active (06/09/2021)   Received from [facility], [facility]  Social Connections: Unknown (12/22/2021)   Received from [facility], [facility]  Stress: Stress Concern Present (06/09/2021)   Received from [facility], [facility]  Tobacco Use: High Risk (06/19/2023)   SDOH Interventions: Food Insecurity Interventions: Intervention Not Indicated, Inpatient TOC Housing Interventions: Intervention Not Indicated, Inpatient TOC Transportation Interventions: Intervention Not Indicated, Inpatient TOC Utilities Interventions: Intervention Not Indicated, Inpatient TOC   Readmission Risk Interventions     No data to display

## 2023-06-20 NOTE — Plan of Care (Signed)
  Problem: Coping: Goal: Level of anxiety will decrease Outcome: Progressing   Problem: Pain Management: Goal: General experience of comfort will improve Outcome: Progressing   Problem: Safety: Goal: Ability to remain free from injury will improve Outcome: Progressing   Problem: Coping: Goal: Level of anxiety will decrease Outcome: Progressing   Problem: Pain Management: Goal: General experience of comfort will improve Outcome: Progressing   Problem: Safety: Goal: Ability to remain free from injury will improve Outcome: Progressing

## 2023-06-20 NOTE — Progress Notes (Signed)
PROGRESS NOTE    Andrew Gibson  WGN:[id number] DOB: [birth year] DOA: 06/19/2023 PCP: Avel Sensor, MD   Brief Narrative: 37 y.o. male with medical history significant of type 2 diabetes, hidradenitis, hypertension, hypochondriac, depression, OCD, nephrolithiasis, history umbilical hernia who presented to the emergency department with complaints of having an abscess in his right groin area the first appendectomy here on Tuesday on the side where he had a previous small abscess that resolved spontaneously last year, but has grown significantly over the past 4 days associated with erythema and tenderness of the area. He denied fever, chills, rhinorrhea, sore throat, wheezing or hemoptysis.  No chest pain, palpitations, diaphoresis, PND, orthopnea or pitting edema of the lower extremities.  No abdominal pain, nausea, emesis, diarrhea, constipation, melena or hematochezia.  No flank pain, dysuria, frequency or hematuria.  No polyuria, polydipsia, polyphagia or blurred vision.    Lab work: CBC showed a white count 7.9, hemoglobin 13.4 g deciliter platelets 198.  BMP showed a sign 31, potassium 3.2, chloride 95 and CO2 27 mmol/L.  Glucose 331 mg/deciliter.  Renal function and calcium are normal.   Imaging: Scrotal ultrasound shows small bilateral hydroceles, left greater than right.  The left hydrocele contains low-level internal echoes and some septations, potentially from prior infection/inflammation or hemorrhage.  5 mm right epididymal cyst or spermatocele.  No testicular abnormality.  CT pelvis with contrast showed inflammatory changes in the soft tissues of the perineum with a rim-enhancing fluid and gas containing collection in the right perineum measuring 6.0 x 2.3 x 4.3 cm, compatible with an abscess.  Small bilateral hydrocele leads.  No acute intrapelvic abnormality.   ED course: Initial vital signs were temperature 97.6 F, pulse 80, respirations 17, BP 191/112 mmHg and O2 sat 100% on room  air.  Patient received morphine 6 mg IVP x 1, a tablet of Percocet/5 325 mg, vancomycin and Zosyn IVPB.  Assessment & Plan:   Principal Problem:   Perineal abscess Active Problems:   Hypertension   MDD (major depressive disorder)   OCD (obsessive compulsive disorder)   Type 2 diabetes mellitus with hyperglycemia (HCC)   Class 3 obesity   Hyponatremia   Hypokalemia   Scrotal abscess  Scrotal abscess status post I&D 06/19/2023 by urology Continue Zosyn while in hospital We will discharge him on Bactrim Urology following recommending no wound dressing changes for 24 hours due to recent bleed Follow-up labs in a.m.   Active Problems:   Hypokalemia resolved Secondary to diuretic use Potassium supplement ordered.    Hyponatremia resolved Minimal. Secondary to HCTZ use. Follow sodium level in AM. Discontinue HCTZ if it worsens.     Hypertension Continue atenolol 1 tablet p.o. daily. Continue Zestoretic 20-12.5 mg p.o. daily.     MDD (major depressive disorder)   OCD (obsessive compulsive disorder) Continue sertraline 200 mg p.o. daily.     Type 2 diabetes mellitus with hyperglycemia (HCC) A1c 11.9 on admission CBG (last 3)  Recent Labs    06/19/23 2145 06/20/23 0729 06/20/23 1312  GLUCAP 405* 256* 300*   Carbohydrate modified diet. Continue metformin 1000 mg p.o. twice daily. CBG monitoring with RI SS. Start semiglee Dm coordinator consult      Class 3 obesity  Current BMI  kg/m. Lifestyle modifications. Follow-up with closely PCP and/or bariatric clinic.    Estimated body mass index is 46.61 kg/m as calculated from the following:   Height as of this encounter: 5\' 7"  (1.702 m).   Weight as of  this encounter: 135 kg.  DVT prophylaxis: SCD Code Status: Full  family Communication: Wife at bedside  disposition Plan:  Status is: Inpatient Remains inpatient appropriate because: Status post scrotal abscess drainage bleeding from site on IV antibiotics    Consultants:  Urology  Procedures: Post I&D of scrotal abscess on 06/19/2023 status Antimicrobials: Zosyn  Subjective: Resting in bed wife at bedside  Objective: Vitals:   06/19/23 1725 06/19/23 2138 06/20/23 0142 06/20/23 0618  BP: (!) 149/76 132/68 (!) 125/59 (!) 169/91  Pulse: 72 79 64 66  Resp: 18 16 16 20   Temp: 98.4 F (36.9 C) 97.7 F (36.5 C) 97.7 F (36.5 C) 98 F (36.7 C)  TempSrc: Oral Oral Oral Oral  SpO2: 95% 96% 93% 97%  Weight:      Height:        Intake/Output Summary (Last 24 hours) at 06/20/2023 1322 Last data filed at 06/20/2023 0600 Gross per 24 hour  Intake 824.25 ml  Output 420 ml  Net 404.25 ml   Filed Weights   06/19/23 1440  Weight: 135 kg    Examination:  General exam: Appears in no acute distress Respiratory system: Clear to auscultation. Respiratory effort normal. Cardiovascular system: S1 & S2 heard, RRR. No JVD, murmurs, rubs, gallops or clicks. No pedal edema. Gastrointestinal system: Abdomen is nondistended, soft and nontender. No organomegaly or masses felt. Normal bowel sounds heard.  Scrotal area covered with dressing Central nervous system: Alert and oriented. No focal neurological deficits. Extremities: No edema Data Reviewed: I have personally reviewed following labs and imaging studies  CBC: Recent Labs  Lab 06/19/23 0827 06/20/23 0533  WBC 7.9 9.6  NEUTROABS 6.1  --   HGB 13.4 13.5  HCT 39.5 40.8  MCV 81.6 83.3  PLT 198 198   Basic Metabolic Panel: Recent Labs  Lab 06/19/23 0827 06/20/23 0533  NA 131* 136  K 3.2* 4.5  CL 95* 104  CO2 27 23  GLUCOSE 331* 288*  BUN 11 16  CREATININE 0.63 0.64  CALCIUM 8.9 8.1*  MG  --  2.3  PHOS  --  3.0   GFR: Estimated Creatinine Clearance: 167.6 mL/min (by C-G formula based on SCr of 0.64 mg/dL). Liver Function Tests: Recent Labs  Lab 06/20/23 0533  AST 26  ALT 26  ALKPHOS 94  BILITOT 1.0  PROT 7.2  ALBUMIN 3.4*   No results for input(s): "LIPASE",  "AMYLASE" in the last 168 hours. No results for input(s): "AMMONIA" in the last 168 hours. Coagulation Profile: No results for input(s): "INR", "PROTIME" in the last 168 hours. Cardiac Enzymes: No results for input(s): "CKTOTAL", "CKMB", "CKMBINDEX", "TROPONINI" in the last 168 hours. BNP (last 3 results) No results for input(s): "PROBNP" in the last 8760 hours. HbA1C: Recent Labs    06/20/23 0533  HGBA1C 11.9*   CBG: Recent Labs  Lab 06/19/23 1607 06/19/23 1752 06/19/23 2145 06/20/23 0729 06/20/23 1312  GLUCAP 248* 294* 405* 256* 300*   Lipid Profile: No results for input(s): "CHOL", "HDL", "LDLCALC", "TRIG", "CHOLHDL", "LDLDIRECT" in the last 72 hours. Thyroid Function Tests: No results for input(s): "TSH", "T4TOTAL", "FREET4", "T3FREE", "THYROIDAB" in the last 72 hours. Anemia Panel: No results for input(s): "VITAMINB12", "FOLATE", "FERRITIN", "TIBC", "IRON", "RETICCTPCT" in the last 72 hours. Sepsis Labs: No results for input(s): "PROCALCITON", "LATICACIDVEN" in the last 168 hours.  Recent Results (from the past 240 hour(s))  Aerobic/Anaerobic Culture w Gram Stain (surgical/deep wound)     Status: None (Preliminary result)  Collection Time: 06/19/23  4:13 PM   Specimen: Path fluid; Body Fluid  Result Value Ref Range Status   Specimen Description   Final    FLUID Performed at [facility], [address], [place], [place] [address]    Special Requests   Final    NONE Performed at [facility], [address] [place], [place] [address]    Gram Stain   Final    FEW WBC PRESENT, PREDOMINANTLY PMN FEW GRAM POSITIVE COCCI Performed at [facility], [address] [place], [place] [address]    Culture PENDING  Incomplete   Report Status PENDING  Incomplete         Radiology Studies: CT PELVIS W CONTRAST  Result Date: 06/19/2023 CLINICAL DATA:  Soft tissue infection suspected, pelvis, xray done EXAM: CT PELVIS  WITH CONTRAST TECHNIQUE: Multidetector CT imaging of the pelvis was performed using the standard protocol following the bolus administration of intravenous contrast. RADIATION DOSE REDUCTION: This exam was performed according to the departmental dose-optimization program which includes automated exposure control, adjustment of the mA and/or kV according to patient size and/or use of iterative reconstruction technique. CONTRAST:  OMNIPAQUE IOHEXOL 300 MG/ML  SOLN COMPARISON:  06/02/2013 FINDINGS: Urinary Tract:  No abnormality visualized. Bowel: No evidence of bowel obstruction or active bowel inflammation within the pelvis. Scattered sigmoid diverticula. Vascular/Lymphatic: No significant vascular abnormality. Mildly prominent bilateral inguinal and external iliac chain lymph nodes, likely reactive. No lymphadenopathy by size criteria. Reproductive:  Prostate gland is normal in appearance. Other:  No free air or free fluid within the pelvis. Musculoskeletal: No acute osseous abnormality. No suspicious bone lesion. Incidentally noted bone islands within the right iliac bone and right pubic. Inflammatory changes in the soft tissues of the perineum. There is a rim enhancing fluid and gas containing collection in the right perineum measuring 6.0 x 2.3 x 4.3 cm. Aside from this collection, there is no additional foci of gas within the soft tissues. Small bilateral hydroceles. IMPRESSION: 1. Inflammatory changes in the soft tissues of the perineum with a rim-enhancing fluid and gas containing collection in the right perineum measuring 6.0 x 2.3 x 4.3 cm, compatible with abscess. 2. Small bilateral hydroceles. 3. No acute intrapelvic abnormality. Electronically Signed   By: Duanne Guess D.O.   On: 06/19/2023 12:57   US Scrotum  Result Date: 06/19/2023 CLINICAL DATA:  Right-sided abscess. EXAM: ULTRASOUND OF SCROTUM TECHNIQUE: Complete ultrasound examination of the testicles, epididymis, and other scrotal  structures was performed. COMPARISON:  None Available. FINDINGS: Right testicle Measurements: 4.3 x 2.0 x 3.2 cm. No mass or microlithiasis visualized. Left testicle Measurements: 4.1 x 2.4 x 2.9 cm. No mass or microlithiasis visualized. Right epididymis:  5 mm epididymal cyst or spermatocele. Left epididymis:  Normal in size and appearance. Hydrocele: Small right-sided hydrocele. Small left hydrocele contains low level internal echoes and some septations, Varicocele:  None visualized. Measurements: 4.3 x 3.0 x 3.2 cm. No mass or microlithiasis visualized. IMPRESSION: 1. Small bilateral hydroceles, left greater than right. The left hydrocele contains low level internal echoes and some septations, potentially from prior infection/inflammation or hemorrhage. 2. 5 mm right epididymal cyst or spermatocele. 3. No testicular abnormality. Electronically Signed   By: Kennith Center M.D.   On: 06/19/2023 08:38    Scheduled Meds:  atenolol  50 mg Oral Daily   lisinopril  20 mg Oral Daily   And   hydrochlorothiazide  12.5 mg Oral Daily  insulin aspart  0-20 Units Subcutaneous TID WC   [START ON 06/21/2023] metFORMIN  1,000 mg Oral BID WC   sertraline  200 mg Oral Daily   Continuous Infusions:  piperacillin-tazobactam (ZOSYN)  IV 3.375 g (06/20/23 0935)   vancomycin     Followed by   vancomycin     vancomycin 1,500 mg (06/20/23 0510)     LOS: 1 day    Time spent: 39 min  Alwyn Ren, MD  06/20/2023, 1:22 PM

## 2023-06-20 NOTE — Progress Notes (Addendum)
1 Day Post-Op Subjective: POD #1 s/p I&D scrotal abscess. Feeling better.  Pain improved.  No fever or chills. Voiding spontaneously.  Wound culture pending - gram stain with gram + cocci  Objective: Vital signs in last 24 hours: Temp:  [97.1 F (36.2 C)-98.6 F (37 C)] 98 F (36.7 C) (11/03 0618) Pulse Rate:  [64-79] 66 (11/03 0618) Resp:  [11-20] 20 (11/03 0618) BP: (125-181)/(59-100) 169/91 (11/03 0618) SpO2:  [92 %-100 %] 97 % (11/03 0618) Weight:  [135 kg] 135 kg (11/02 1440)  Intake/Output from previous day: 11/02 0701 - 11/03 0700 In: 824.3 [P.O.:50; I.V.:500; IV Piggyback:274.3] Out: 420 [Urine:400; Blood:20] Intake/Output this shift: No intake/output data recorded.  Physical Exam:  General: Alert and oriented Abdomen: Soft, ND Ext: NT, No erythema GU:  Scrotal wound without purulent drainage; no necrotic tissue; some surrounding edema; wound packing changed  Lab Results: Recent Labs    06/19/23 0827 06/20/23 0533  HGB 13.4 13.5  HCT 39.5 40.8   BMET Recent Labs    06/19/23 0827 06/20/23 0533  NA 131* 136  K 3.2* 4.5  CL 95* 104  CO2 27 23  GLUCOSE 331* 288*  BUN 11 16  CREATININE 0.63 0.64  CALCIUM 8.9 8.1*     Studies/Results: CT PELVIS W CONTRAST  Result Date: 06/19/2023 CLINICAL DATA:  Soft tissue infection suspected, pelvis, xray done EXAM: CT PELVIS WITH CONTRAST TECHNIQUE: Multidetector CT imaging of the pelvis was performed using the standard protocol following the bolus administration of intravenous contrast. RADIATION DOSE REDUCTION: This exam was performed according to the departmental dose-optimization program which includes automated exposure control, adjustment of the mA and/or kV according to patient size and/or use of iterative reconstruction technique. CONTRAST:  OMNIPAQUE IOHEXOL 300 MG/ML  SOLN COMPARISON:  06/02/2013 FINDINGS: Urinary Tract:  No abnormality visualized. Bowel: No evidence of bowel obstruction or active bowel  inflammation within the pelvis. Scattered sigmoid diverticula. Vascular/Lymphatic: No significant vascular abnormality. Mildly prominent bilateral inguinal and external iliac chain lymph nodes, likely reactive. No lymphadenopathy by size criteria. Reproductive:  Prostate gland is normal in appearance. Other:  No free air or free fluid within the pelvis. Musculoskeletal: No acute osseous abnormality. No suspicious bone lesion. Incidentally noted bone islands within the right iliac bone and right pubic. Inflammatory changes in the soft tissues of the perineum. There is a rim enhancing fluid and gas containing collection in the right perineum measuring 6.0 x 2.3 x 4.3 cm. Aside from this collection, there is no additional foci of gas within the soft tissues. Small bilateral hydroceles. IMPRESSION: 1. Inflammatory changes in the soft tissues of the perineum with a rim-enhancing fluid and gas containing collection in the right perineum measuring 6.0 x 2.3 x 4.3 cm, compatible with abscess. 2. Small bilateral hydroceles. 3. No acute intrapelvic abnormality. Electronically Signed   By: Duanne Guess D.O.   On: 06/19/2023 12:57   US Scrotum  Result Date: 06/19/2023 CLINICAL DATA:  Right-sided abscess. EXAM: ULTRASOUND OF SCROTUM TECHNIQUE: Complete ultrasound examination of the testicles, epididymis, and other scrotal structures was performed. COMPARISON:  None Available. FINDINGS: Right testicle Measurements: 4.3 x 2.0 x 3.2 cm. No mass or microlithiasis visualized. Left testicle Measurements: 4.1 x 2.4 x 2.9 cm. No mass or microlithiasis visualized. Right epididymis:  5 mm epididymal cyst or spermatocele. Left epididymis:  Normal in size and appearance. Hydrocele: Small right-sided hydrocele. Small left hydrocele contains low level internal echoes and some septations, Varicocele:  None visualized. Measurements: 4.3 x  3.0 x 3.2 cm. No mass or microlithiasis visualized. IMPRESSION: 1. Small bilateral hydroceles, left  greater than right. The left hydrocele contains low level internal echoes and some septations, potentially from prior infection/inflammation or hemorrhage. 2. 5 mm right epididymal cyst or spermatocele. 3. No testicular abnormality. Electronically Signed   By: Kennith Center M.D.   On: 06/19/2023 08:38    Assessment/Plan: Scrotal abscess - POD #1 s/p I&D  Doing well post-op OK for discharge from urology standpoint.   Recommend wet to dry dressing changes BID Await results of wound culture Recommend Bactrim x 2 weeks at discharge Will arrange for outpatient follow-up for wound check  Addendum: Called to see patient for bleeding from scrotal wound. Packing removed and wound examined.  Bleeding noted from wound base and inferior skin edge.  Hemostasis obtained with pressure and cauterization with silver nitrate.  Patient tolerated well. Recommend holding on dressing changes for 24 hours. Recommend continued observation for any further bleeding and dressing change in hospital tomorrow AM.    LOS: 1 day   Di Kindle 06/20/2023, 8:24 AM

## 2023-06-21 ENCOUNTER — Other Ambulatory Visit (HOSPITAL_COMMUNITY): Payer: Self-pay

## 2023-06-21 ENCOUNTER — Encounter (HOSPITAL_COMMUNITY): Payer: Self-pay | Admitting: Urology

## 2023-06-21 DIAGNOSIS — L02215 Cutaneous abscess of perineum: Secondary | ICD-10-CM | POA: Diagnosis not present

## 2023-06-21 LAB — CREATININE, SERUM
Creatinine, Ser: 0.65 mg/dL (ref 0.61–1.24)
GFR, Estimated: >60 mL/min (ref >60)

## 2023-06-21 LAB — GLUCOSE, CAPILLARY
Glucose-Capillary: 267 mg/dL — ABNORMAL HIGH (ref 70–99)
Glucose-Capillary: 290 mg/dL — ABNORMAL HIGH (ref 70–99)

## 2023-06-21 MED ORDER — BLOOD GLUCOSE TEST VI STRP: 1.0000 | ORAL_STRIP | Freq: Three times a day (TID) | 0 refills | Status: AC

## 2023-06-21 MED ORDER — INSULIN LISPRO (1 UNIT DIAL) 100 UNIT/ML (KWIKPEN): 10.0000 [IU] | PEN_INJECTOR | Freq: Three times a day (TID) | SUBCUTANEOUS | 0 refills | Status: AC

## 2023-06-21 MED ORDER — LANCETS MISC: 1.0000 | Freq: Three times a day (TID) | 0 refills | Status: AC

## 2023-06-21 MED ORDER — BLOOD GLUCOSE MONITORING SUPPL DEVI: 1.0000 | Freq: Three times a day (TID) | 0 refills | Status: AC

## 2023-06-21 MED ORDER — LANCET DEVICE MISC: 1.0000 | Freq: Three times a day (TID) | 0 refills | Status: AC

## 2023-06-21 MED ORDER — INSULIN LISPRO 100 UNIT/ML IJ SOLN: INTRAMUSCULAR | 3 refills | Status: DC

## 2023-06-21 MED ORDER — BD PEN NEEDLE NANO U/F 32G X 4 MM MISC: 25.0000 [IU] | Freq: Four times a day (QID) | 4 refills | Status: AC

## 2023-06-21 MED ORDER — BASAGLAR KWIKPEN 100 UNIT/ML ~~LOC~~ SOPN: 25.0000 [IU] | PEN_INJECTOR | Freq: Every day | SUBCUTANEOUS | 4 refills | Status: DC

## 2023-06-21 MED ORDER — BASAGLAR KWIKPEN 100 UNIT/ML ~~LOC~~ SOPN: 25.0000 [IU] | PEN_INJECTOR | Freq: Every day | SUBCUTANEOUS | 0 refills | Status: AC

## 2023-06-21 MED ORDER — ACETAMINOPHEN 325 MG PO TABS: 650.0000 mg | ORAL_TABLET | Freq: Four times a day (QID) | ORAL | Status: AC | PRN

## 2023-06-21 NOTE — TOC Transition Note (Signed)
Transition of Care [facility]) - CM/SW Discharge Note   Patient Details  Name: Andrew Gibson MRN: [id number] Date of Birth: March 12, 1986  [facility]) CM/SW Contact:  Lanier Clam, RN Phone Number: 06/21/2023, 12:46 PM   Clinical Narrative:  d/c home no needs or orders.       Barriers to Discharge: No Barriers Identified   Patient Goals and CMS Choice      Discharge Placement                         Discharge Plan and Services Additional resources added to the After Visit Summary for     Discharge Planning Services: CM Consult                                 Social Determinants of Health (SDOH) Interventions SDOH Screenings   Food Insecurity: No Food Insecurity (06/20/2023)  Housing: Low Risk  (06/20/2023)  Transportation Needs: No Transportation Needs (06/20/2023)  Utilities: Not At Risk (06/20/2023)  Financial Resource Strain: Low Risk  (06/09/2021)   [facility], [facility]  Physical Activity: Sufficiently Active (06/09/2021)   Received from [facility], [facility]  Social Connections: Unknown (12/22/2021)   Received from [facility], [facility]  Stress: Stress Concern Present (06/09/2021)   Received from [facility], [facility]  Tobacco Use: High Risk (06/19/2023)     Readmission Risk Interventions     No data to display

## 2023-06-21 NOTE — Discharge Summary (Signed)
Physician Discharge Summary  MAHLON GABRIELLE WUJ:[id number] DOB: Aug 14, 1986 DOA: 06/19/2023  PCP: Avel Sensor, MD  Admit date: 06/19/2023 Discharge date: 06/21/2023  Admitted From: home Disposition: home  Recommendations for Outpatient Follow-up:  Follow up with PCP in 1-2 weeks Please obtain BMP/CBC in one week Please follow up with urology  Home Health:none Equipment/Devices:none  Discharge Condition:stable CODE STATUS:full Diet recommendation: carb modified  Brief/Interim Summary: 37 y.o. male with medical history significant of type 2 diabetes, hidradenitis, hypertension, hypochondriac, depression, OCD, nephrolithiasis, history umbilical hernia who presented to the emergency department with complaints of having an abscess in his right groin area the first appendectomy here on Tuesday on the side where he had a previous small abscess that resolved spontaneously last year, but has grown significantly over the past 4 days associated with erythema and tenderness of the area. He denied fever, chills, rhinorrhea, sore throat, wheezing or hemoptysis.  No chest pain, palpitations, diaphoresis, PND, orthopnea or pitting edema of the lower extremities.  No abdominal pain, nausea, emesis, diarrhea, constipation, melena or hematochezia.  No flank pain, dysuria, frequency or hematuria.  No polyuria, polydipsia, polyphagia or blurred vision.    Lab work: CBC showed a white count 7.9, hemoglobin 13.4 g deciliter platelets 198.  BMP showed a sign 31, potassium 3.2, chloride 95 and CO2 27 mmol/L.  Glucose 331 mg/deciliter.  Renal function and calcium are normal.   Imaging: Scrotal ultrasound shows small bilateral hydroceles, left greater than right.  The left hydrocele contains low-level internal echoes and some septations, potentially from prior infection/inflammation or hemorrhage.  5 mm right epididymal cyst or spermatocele.  No testicular abnormality.  CT pelvis with contrast showed inflammatory  changes in the soft tissues of the perineum with a rim-enhancing fluid and gas containing collection in the right perineum measuring 6.0 x 2.3 x 4.3 cm, compatible with an abscess.  Small bilateral hydrocele leads.  No acute intrapelvic abnormality.   ED course: Initial vital signs were temperature 97.6 F, pulse 80, respirations 17, BP 191/112 mmHg and O2 sat 100% on room air.  Patient received morphine 6 mg IVP x 1, a tablet of Percocet/5 325 mg, vancomycin and Zosyn IVPB.   Discharge Diagnoses:  Principal Problem:   Perineal abscess Active Problems:   Hypertension   MDD (major depressive disorder)   OCD (obsessive compulsive disorder)   Type 2 diabetes mellitus with hyperglycemia (HCC)   Class 3 obesity   Hyponatremia   Hypokalemia   Scrotal abscess     Scrotal abscess status post I&D 06/19/2023 by urology.  He received Zosyn during the hospital stay and discharged on Bactrim double strength 1 twice daily for 14 days.  Patient will follow-up with urology.   Active Problems:   Hypokalemia resolved Secondary to diuretic use    Hyponatremia resolved Secondary to HCTZ use.     Hypertension Continue atenolol 1 tablet p.o. daily. Continue Zestoretic 20-12.5 mg p.o. daily.     MDD (major depressive disorder)   OCD (obsessive compulsive disorder) Continue sertraline 200 mg p.o. daily.     Type 2 diabetes mellitus with hyperglycemia (HCC) A1c 11.9 on admission CBG (last 3)  Recent Labs (last 2 labs)       Recent Labs    06/19/23 2145 06/20/23 0729 06/20/23 1312  GLUCAP 405* 256* 300*    Patient was seen by diabetic coordinator he was sent home on long-acting insulin and Humalog.  He was told not to take metformin for 2 weeks while he  is on Bactrim.     Class 3 obesity  Current BMI  kg/m. Lifestyle modifications. Follow-up with closely PCP    Estimated body mass index is 46.61 kg/m as calculated from the following:   Height as of this encounter: 5\' 7"  (1.702 m).    Weight as of this encounter: 135 kg.  Discharge Instructions  Discharge Instructions     Diet - low sodium heart healthy   Complete by: As directed    Increase activity slowly   Complete by: As directed       Allergies as of 06/21/2023       Reactions   Other Nausea And Vomiting, Rash   Tomato paste Black beans- fainting         Medication List     STOP taking these medications    doxycycline 100 MG capsule Commonly known as: VIBRAMYCIN   ibuprofen 800 MG tablet Commonly known as: ADVIL       TAKE these medications    acetaminophen 325 MG tablet Commonly known as: TYLENOL Take 2 tablets (650 mg total) by mouth every 6 (six) hours as needed for mild pain (pain score 1-3) (or Fever >/= 101).   atenolol 50 MG tablet Commonly known as: TENORMIN Take 1 tablet by mouth daily.   Basaglar KwikPen 100 UNIT/ML Inject 25 Units into the skin daily. May substitute as needed per insurance.   BD Pen Needle Nano U/F 32G X 4 MM Misc Generic drug: Insulin Pen Needle 25 Units by Does not apply route 4 (four) times daily.   Blood Glucose Monitoring Suppl Devi 1 each by Does not apply route 3 (three) times daily. May dispense any manufacturer covered by patient's insurance.   BLOOD GLUCOSE TEST STRIPS Strp 1 each by Does not apply route 3 (three) times daily. Use as directed to check blood sugar. May dispense any manufacturer covered by patient's insurance and fits patient's device.   insulin lispro 100 UNIT/ML KwikPen Commonly known as: HUMALOG Inject 10 Units into the skin 3 (three) times daily. Only take if eating a meal AND Blood Glucose (BG) is 80 or higher.   Lancet Device Misc 1 each by Does not apply route 3 (three) times daily. May dispense any manufacturer covered by patient's insurance.   Lancets Misc 1 each by Does not apply route 3 (three) times daily. Use as directed to check blood sugar. May dispense any manufacturer covered by patient's insurance and fits  patient's device.   lisinopril-hydrochlorothiazide 20-12.5 MG tablet Commonly known as: ZESTORETIC Take 2 tablets by mouth daily.   metFORMIN 1000 MG tablet Commonly known as: GLUCOPHAGE Take 1 tablet by mouth 2 (two) times daily.   oxyCODONE 5 MG immediate release tablet Commonly known as: Oxy IR/ROXICODONE Take 1 tablet (5 mg total) by mouth every 4 (four) hours as needed for moderate pain (pain score 4-6).   sertraline 100 MG tablet Commonly known as: ZOLOFT Take 200 mg by mouth daily.   sulfamethoxazole-trimethoprim 800-160 MG tablet Commonly known as: BACTRIM DS Take 1 tablet by mouth 2 (two) times daily for 14 days.        Follow-up Information     Stoneking, Danford Bad., MD Follow up.   Specialty: Urology Contact information: [address] [place] [address] [phone]                Allergies  Allergen Reactions   Other Nausea And Vomiting and Rash    Tomato paste  Black beans- fainting     Consultations:urology   Procedures/Studies: CT PELVIS W CONTRAST  Result Date: 06/19/2023 CLINICAL DATA:  Soft tissue infection suspected, pelvis, xray done EXAM: CT PELVIS WITH CONTRAST TECHNIQUE: Multidetector CT imaging of the pelvis was performed using the standard protocol following the bolus administration of intravenous contrast. RADIATION DOSE REDUCTION: This exam was performed according to the departmental dose-optimization program which includes automated exposure control, adjustment of the mA and/or kV according to patient size and/or use of iterative reconstruction technique. CONTRAST:  OMNIPAQUE IOHEXOL 300 MG/ML  SOLN COMPARISON:  06/02/2013 FINDINGS: Urinary Tract:  No abnormality visualized. Bowel: No evidence of bowel obstruction or active bowel inflammation within the pelvis. Scattered sigmoid diverticula. Vascular/Lymphatic: No significant vascular abnormality. Mildly prominent bilateral inguinal and external iliac chain  lymph nodes, likely reactive. No lymphadenopathy by size criteria. Reproductive:  Prostate gland is normal in appearance. Other:  No free air or free fluid within the pelvis. Musculoskeletal: No acute osseous abnormality. No suspicious bone lesion. Incidentally noted bone islands within the right iliac bone and right pubic. Inflammatory changes in the soft tissues of the perineum. There is a rim enhancing fluid and gas containing collection in the right perineum measuring 6.0 x 2.3 x 4.3 cm. Aside from this collection, there is no additional foci of gas within the soft tissues. Small bilateral hydroceles. IMPRESSION: 1. Inflammatory changes in the soft tissues of the perineum with a rim-enhancing fluid and gas containing collection in the right perineum measuring 6.0 x 2.3 x 4.3 cm, compatible with abscess. 2. Small bilateral hydroceles. 3. No acute intrapelvic abnormality. Electronically Signed   By: Duanne Guess D.O.   On: 06/19/2023 12:57   US Scrotum  Result Date: 06/19/2023 CLINICAL DATA:  Right-sided abscess. EXAM: ULTRASOUND OF SCROTUM TECHNIQUE: Complete ultrasound examination of the testicles, epididymis, and other scrotal structures was performed. COMPARISON:  None Available. FINDINGS: Right testicle Measurements: 4.3 x 2.0 x 3.2 cm. No mass or microlithiasis visualized. Left testicle Measurements: 4.1 x 2.4 x 2.9 cm. No mass or microlithiasis visualized. Right epididymis:  5 mm epididymal cyst or spermatocele. Left epididymis:  Normal in size and appearance. Hydrocele: Small right-sided hydrocele. Small left hydrocele contains low level internal echoes and some septations, Varicocele:  None visualized. Measurements: 4.3 x 3.0 x 3.2 cm. No mass or microlithiasis visualized. IMPRESSION: 1. Small bilateral hydroceles, left greater than right. The left hydrocele contains low level internal echoes and some septations, potentially from prior infection/inflammation or hemorrhage. 2. 5 mm right epididymal  cyst or spermatocele. 3. No testicular abnormality. Electronically Signed   By: Kennith Center M.D.   On: 06/19/2023 08:38   (Echo, Carotid, EGD, Colonoscopy, ERCP)    Subjective: Anxious to go home  Discharge Exam: Vitals:   06/21/23 0430 06/21/23 1149  BP: (!) 179/94 (!) 163/81  Pulse: 60 (!) 52  Resp: 19 16  Temp: (!) 97.5 F (36.4 C) 98 F (36.7 C)  SpO2: 98% 98%   Vitals:   06/20/23 1544 06/20/23 1959 06/21/23 0430 06/21/23 1149  BP: (!) 152/75 (!) 147/93 (!) 179/94 (!) 163/81  Pulse: 63 63 60 (!) 52  Resp: 20 18 19 16   Temp: 98.5 F (36.9 C) (!) 97.5 F (36.4 C) (!) 97.5 F (36.4 C) 98 F (36.7 C)  TempSrc: Oral Oral Oral Oral  SpO2: 97% 98% 98% 98%  Weight:      Height:        General: Pt is alert, awake, not in  acute distress Cardiovascular: RRR, S1/S2 +, no rubs, no gallops Respiratory: CTA bilaterally, no wheezing, no rhonchi Abdominal: Soft, NT, ND, bowel sounds + scrotal area covered with dressing Extremities: no edema, no cyanosis    The results of significant diagnostics from this hospitalization (including imaging, microbiology, ancillary and laboratory) are listed below for reference.     Microbiology: Recent Results (from the past 240 hour(s))  Aerobic/Anaerobic Culture w Gram Stain (surgical/deep wound)     Status: None (Preliminary result)   Collection Time: 06/19/23  4:13 PM   Specimen: Path fluid; Body Fluid  Result Value Ref Range Status   Specimen Description   Final    FLUID Performed at [facility], [address], [place], [place] [address]    Special Requests   Final    NONE Performed at [facility], [address] [place], [place] [address]    Gram Stain   Final    FEW WBC PRESENT, PREDOMINANTLY PMN FEW GRAM POSITIVE COCCI    Culture   Final    RARE GROUP B STREP(S.AGALACTIAE)ISOLATED TESTING AGAINST S. AGALACTIAE NOT ROUTINELY PERFORMED DUE TO PREDICTABILITY OF AMP/PEN/VAN  SUSCEPTIBILITY. HOLDING FOR POSSIBLE ANAEROBE Performed at [facility], [address], [place], [place] [address]    Report Status PENDING  Incomplete     Labs: BNP (last 3 results) No results for input(s): "BNP" in the last 8760 hours. Basic Metabolic Panel: Recent Labs  Lab 06/19/23 0827 06/20/23 0533 06/21/23 0433  NA 131* 136  --   K 3.2* 4.5  --   CL 95* 104  --   CO2 27 23  --   GLUCOSE 331* 288*  --   BUN 11 16  --   CREATININE 0.63 0.64 0.65  CALCIUM 8.9 8.1*  --   MG  --  2.3  --   PHOS  --  3.0  --    Liver Function Tests: Recent Labs  Lab 06/20/23 0533  AST 26  ALT 26  ALKPHOS 94  BILITOT 1.0  PROT 7.2  ALBUMIN 3.4*   No results for input(s): "LIPASE", "AMYLASE" in the last 168 hours. No results for input(s): "AMMONIA" in the last 168 hours. CBC: Recent Labs  Lab 06/19/23 0827 06/20/23 0533  WBC 7.9 9.6  NEUTROABS 6.1  --   HGB 13.4 13.5  HCT 39.5 40.8  MCV 81.6 83.3  PLT 198 198   Cardiac Enzymes: No results for input(s): "CKTOTAL", "CKMB", "CKMBINDEX", "TROPONINI" in the last 168 hours. BNP: Invalid input(s): "POCBNP" CBG: Recent Labs  Lab 06/20/23 1312 06/20/23 1610 06/20/23 2031 06/21/23 0755 06/21/23 1146  GLUCAP 300* 334* 341* 267* 290*   D-Dimer No results for input(s): "DDIMER" in the last 72 hours. Hgb A1c Recent Labs    06/20/23 0533  HGBA1C 11.9*   Lipid Profile No results for input(s): "CHOL", "HDL", "LDLCALC", "TRIG", "CHOLHDL", "LDLDIRECT" in the last 72 hours. Thyroid function studies No results for input(s): "TSH", "T4TOTAL", "T3FREE", "THYROIDAB" in the last 72 hours.  Invalid input(s): "FREET3" Anemia work up No results for input(s): "VITAMINB12", "FOLATE", "FERRITIN", "TIBC", "IRON", "RETICCTPCT" in the last 72 hours. Urinalysis    Component Value Date/Time   COLORURINE AMBER (A) 06/02/2013 1231   APPEARANCEUR HAZY (A) 06/02/2013 1231   LABSPEC 1.020 06/02/2013 1231   PHURINE 7.0 06/02/2013  1231   GLUCOSEU NEGATIVE 06/02/2013 1231   HGBUR NEGATIVE 06/02/2013 1231   BILIRUBINUR NEGATIVE 06/02/2013 1231   KETONESUR NEGATIVE 06/02/2013  1231   PROTEINUR NEGATIVE 06/02/2013 1231   UROBILINOGEN 1.0 06/02/2013 1231   NITRITE NEGATIVE 06/02/2013 1231   LEUKOCYTESUR SMALL (A) 06/02/2013 1231   Sepsis Labs Recent Labs  Lab 06/19/23 0827 06/20/23 0533  WBC 7.9 9.6   Microbiology Recent Results (from the past 240 hour(s))  Aerobic/Anaerobic Culture w Gram Stain (surgical/deep wound)     Status: None (Preliminary result)   Collection Time: 06/19/23  4:13 PM   Specimen: Path fluid; Body Fluid  Result Value Ref Range Status   Specimen Description   Final    FLUID Performed at [facility], [address], [place], [place] [address]    Special Requests   Final    NONE Performed at [facility], [address] [place], [place] [address]    Gram Stain   Final    FEW WBC PRESENT, PREDOMINANTLY PMN FEW GRAM POSITIVE COCCI    Culture   Final    RARE GROUP B STREP(S.AGALACTIAE)ISOLATED TESTING AGAINST S. AGALACTIAE NOT ROUTINELY PERFORMED DUE TO PREDICTABILITY OF AMP/PEN/VAN SUSCEPTIBILITY. HOLDING FOR POSSIBLE ANAEROBE Performed at [facility], [address] [place], [place] [address]    Report Status PENDING  Incomplete     Time coordinating discharge: 39 minutes  SIGNED: Alwyn Ren, MD  [facility] 06/21/2023, 4:12 PM

## 2023-06-21 NOTE — Plan of Care (Signed)
  Problem: Pain Management: Goal: General experience of comfort will improve Outcome: Progressing

## 2023-06-21 NOTE — TOC Benefit Eligibility Note (Signed)
Patient Product/process development scientist completed.    The patient is insured through [employer]. Patient has [employer], may use a copay card, and/or apply for patient assistance if available.    Ran test claim for Basaglar Pen and the current 30 day co-pay is $25.00.  Ran test claim for Humalog KwikPen and the current 30 day co-pay is $25.00.   This test claim was processed through [facility]- copay amounts may vary at other pharmacies due to pharmacy/plan contracts, or as the patient moves through the different stages of their insurance plan.     Roland Earl, CPHT Pharmacy Technician III Certified Patient Advocate [facility] Patient Advocate Team Direct Number: [phone]  Fax: [phone]

## 2023-06-21 NOTE — Progress Notes (Signed)
2 Days Post-Op Subjective: POD #2 s/p I&D scrotal abscess. Issues with small bleeding around wound edges yesterday, successfully cauterized with silver nitrate.   Dressing change this AM uneventful. Tissue with good granulation. Culture still pending.   Objective: Vital signs in last 24 hours: Temp:  [97.5 F (36.4 C)-98.5 F (36.9 C)] 97.5 F (36.4 C) (11/04 0430) Pulse Rate:  [60-63] 60 (11/04 0430) Resp:  [18-20] 19 (11/04 0430) BP: (147-179)/(75-94) 179/94 (11/04 0430) SpO2:  [97 %-98 %] 98 % (11/04 0430)  Intake/Output from previous day: 11/03 0701 - 11/04 0700 In: 465.9 [P.O.:240; IV Piggyback:225.9] Out: 300 [Urine:300] Intake/Output this shift: No intake/output data recorded.  Physical Exam:  General: Alert and oriented Abdomen: Soft, ND Ext: NT, No erythema GU:  Scrotal wound without purulent drainage; no necrotic tissue; some surrounding edema; wound packing changed  Lab Results: Recent Labs    06/19/23 0827 06/20/23 0533  HGB 13.4 13.5  HCT 39.5 40.8   BMET Recent Labs    06/19/23 0827 06/20/23 0533 06/21/23 0433  NA 131* 136  --   K 3.2* 4.5  --   CL 95* 104  --   CO2 27 23  --   GLUCOSE 331* 288*  --   BUN 11 16  --   CREATININE 0.63 0.64 0.65  CALCIUM 8.9 8.1*  --      Studies/Results: CT PELVIS W CONTRAST  Result Date: 06/19/2023 CLINICAL DATA:  Soft tissue infection suspected, pelvis, xray done EXAM: CT PELVIS WITH CONTRAST TECHNIQUE: Multidetector CT imaging of the pelvis was performed using the standard protocol following the bolus administration of intravenous contrast. RADIATION DOSE REDUCTION: This exam was performed according to the departmental dose-optimization program which includes automated exposure control, adjustment of the mA and/or kV according to patient size and/or use of iterative reconstruction technique. CONTRAST:  OMNIPAQUE IOHEXOL 300 MG/ML  SOLN COMPARISON:  06/02/2013 FINDINGS: Urinary Tract:  No abnormality  visualized. Bowel: No evidence of bowel obstruction or active bowel inflammation within the pelvis. Scattered sigmoid diverticula. Vascular/Lymphatic: No significant vascular abnormality. Mildly prominent bilateral inguinal and external iliac chain lymph nodes, likely reactive. No lymphadenopathy by size criteria. Reproductive:  Prostate gland is normal in appearance. Other:  No free air or free fluid within the pelvis. Musculoskeletal: No acute osseous abnormality. No suspicious bone lesion. Incidentally noted bone islands within the right iliac bone and right pubic. Inflammatory changes in the soft tissues of the perineum. There is a rim enhancing fluid and gas containing collection in the right perineum measuring 6.0 x 2.3 x 4.3 cm. Aside from this collection, there is no additional foci of gas within the soft tissues. Small bilateral hydroceles. IMPRESSION: 1. Inflammatory changes in the soft tissues of the perineum with a rim-enhancing fluid and gas containing collection in the right perineum measuring 6.0 x 2.3 x 4.3 cm, compatible with abscess. 2. Small bilateral hydroceles. 3. No acute intrapelvic abnormality. Electronically Signed   By: Duanne Guess D.O.   On: 06/19/2023 12:57   US Scrotum  Result Date: 06/19/2023 CLINICAL DATA:  Right-sided abscess. EXAM: ULTRASOUND OF SCROTUM TECHNIQUE: Complete ultrasound examination of the testicles, epididymis, and other scrotal structures was performed. COMPARISON:  None Available. FINDINGS: Right testicle Measurements: 4.3 x 2.0 x 3.2 cm. No mass or microlithiasis visualized. Left testicle Measurements: 4.1 x 2.4 x 2.9 cm. No mass or microlithiasis visualized. Right epididymis:  5 mm epididymal cyst or spermatocele. Left epididymis:  Normal in size and appearance. Hydrocele: Small right-sided  hydrocele. Small left hydrocele contains low level internal echoes and some septations, Varicocele:  None visualized. Measurements: 4.3 x 3.0 x 3.2 cm. No mass or  microlithiasis visualized. IMPRESSION: 1. Small bilateral hydroceles, left greater than right. The left hydrocele contains low level internal echoes and some septations, potentially from prior infection/inflammation or hemorrhage. 2. 5 mm right epididymal cyst or spermatocele. 3. No testicular abnormality. Electronically Signed   By: Kennith Center M.D.   On: 06/19/2023 08:38    Assessment/Plan: Scrotal abscess - POD #2 s/p I&D  Doing well post-op, will change dressing once by himself prior to discharge. OK for discharge from urology standpoint.   Recommend wet to dry dressing changes BID Await results of wound culture Recommend Bactrim x 2 weeks at discharge Will arrange for outpatient follow-up for wound check     LOS: 2 days   Zettie Pho 06/21/2023, 6:42 AM

## 2023-06-21 NOTE — Inpatient Diabetes Management (Signed)
Inpatient Diabetes Program Recommendations  AACE/ADA: New Consensus Statement on Inpatient Glycemic Control (2015)  Target Ranges:  Prepandial:   less than 140 mg/dL      Peak postprandial:   less than 180 mg/dL (1-2 hours)      Critically ill patients:  140 - 180 mg/dL   Lab Results  Component Value Date   GLUCAP 267 (H) 06/21/2023   HGBA1C 11.9 (H) 06/20/2023    Review of Glycemic Control  Diabetes history: DM2 Outpatient Diabetes medications: metformin 500 mg BID Current orders for Inpatient glycemic control: Semglee 10 BID, Novolog 0-20 TID with meals, metformin 1000 mg BID  HgbA1C - 11.9%  Inpatient Diabetes Program Recommendations:     Basaglar 25 units at bedtime  Humalog 10 units TID with meals   Long discussion with pt and wife regarding his diabetes control. Pt reports he has not been to PCP in the past 2 years. Reports taking metformin although hasn't followed up. Pt is willing to go home on insulin, both basal and bolus (4 shots/day). Discussed A1C results (11.9% on ) and explained that current A1C indicates an average glucose of 290 mg/dl over the past 2-3 months. Discussed glucose and A1C goals. Discussed importance of checking CBGs and maintaining good CBG control to prevent long-term and short-term complications. Explained how hyperglycemia leads to damage within blood vessels which lead to the common complications seen with uncontrolled diabetes. Stressed to the patient the importance of improving glycemic control to prevent further complications from uncontrolled diabetes. Discussed impact of nutrition, exercise, stress, sickness, and medications on diabetes control.  Educated patient and spouse on insulin pen use at home. Reviewed all steps if insulin pen including attachment of needle, 2-unit air shot, dialing up dose, giving injection, removing needle, disposal of sharps, storage of unused insulin, disposal of insulin etc. Patient able to provide successful return  demonstration. Also reviewed troubleshooting with insulin pen. MD to give patient Rxs for insulin pens and insulin pen needles.  Instructed pt to f/u with his PCP within a week or two, after discharge. Pt agrees. Discussion regarding hypoglycemia s/s and treatment. Pt had no other questions/concerns.  See Diabetes Discharge Order set.  Thank you. Ailene Ards, RD, LDN, CDCES Inpatient Diabetes Coordinator [phone]

## 2023-06-23 LAB — AEROBIC/ANAEROBIC CULTURE W GRAM STAIN (SURGICAL/DEEP WOUND)

## 2023-06-24 ENCOUNTER — Ambulatory Visit (INDEPENDENT_AMBULATORY_CARE_PROVIDER_SITE_OTHER): Payer: Managed Care, Other (non HMO) | Admitting: Urology

## 2023-06-24 ENCOUNTER — Encounter: Payer: Self-pay | Admitting: Urology

## 2023-06-24 VITALS — BP 161/86 | HR 66 | Ht 67.5 in | Wt 298.0 lb

## 2023-06-24 DIAGNOSIS — N492 Inflammatory disorders of scrotum: Secondary | ICD-10-CM

## 2023-06-24 DIAGNOSIS — Z87438 Personal history of other diseases of male genital organs: Secondary | ICD-10-CM

## 2023-06-24 DIAGNOSIS — Z09 Encounter for follow-up examination after completed treatment for conditions other than malignant neoplasm: Secondary | ICD-10-CM

## 2023-06-24 MED ORDER — LEVOFLOXACIN 500 MG PO TABS: 500.0000 mg | ORAL_TABLET | Freq: Every day | ORAL | 0 refills | Status: AC

## 2023-06-24 NOTE — Progress Notes (Signed)
   Assessment: 1. Scrotal abscess     Plan: Will change antibiotic coverage to levaquin based on culture results Continue dressing changes daily Return to office in 2 weeks  Chief Complaint: Chief Complaint  Patient presents with   Scrotal abscess    HPI: Andrew Gibson is a 37 y.o. male who presents for continued evaluation of scrotal abscess. He presented to the hospital on 06/19/2023 with a right sided scrotal abscess.  This has been present for several days with worsening inflammation and pain.  He was taken to the operating room on 06/19/2023 for incision and drainage of the scrotal abscess.  He did well following the procedure.  He remained afebrile and hemodynamically stable. Wound culture grew group B strep.  He returns today for follow-up.  He is doing well.  He continues dressing changes twice a day.  No significant drainage from the wound.  His pain is improving.  He continues on Bactrim.   Portions of the above documentation were copied from a prior visit for review purposes only.  Allergies: Allergies  Allergen Reactions   Other Nausea And Vomiting and Rash    Tomato paste Black beans- fainting     PMH: Past Medical History:  Diagnosis Date   Diabetes mellitus without complication (HCC)    Hydradenitis    Hypertension    Hypochondria    Kidney stone    OCD (obsessive compulsive disorder)     PSH: Past Surgical History:  Procedure Laterality Date   ANKLE SURGERY     HERNIA REPAIR     IRRIGATION AND DEBRIDEMENT ABSCESS N/A 06/19/2023   Procedure: IRRIGATION AND DEBRIDEMENT ABSCESS;  Surgeon: Milderd Meager., MD;  Location: [facility];  Service: Urology;  Laterality: N/A;   PILONIDAL CYST DRAINAGE     WISDOM TOOTH EXTRACTION      SH: Social History   Tobacco Use   Smoking status: Every Day    Current packs/day: 0.50    Types: Cigarettes   Smokeless tobacco: Never  Vaping Use   Vaping status: Never Used  Substance Use Topics   Alcohol use: Not  Currently   Drug use: Yes    Types: Marijuana    ROS: Constitutional:  Negative for fever, chills, weight loss CV: Negative for chest pain, previous MI, hypertension Respiratory:  Negative for shortness of breath, wheezing, sleep apnea, frequent cough GI:  Negative for nausea, vomiting, bloody stool, GERD  PE: BP (!) 161/86   Pulse 66   Ht 5' 7.5" (1.715 m)   Wt 298 lb (135.2 kg)   BMI 45.98 kg/m  GENERAL APPEARANCE:  Well appearing, well developed, well nourished, NAD HEENT:  Atraumatic, normocephalic, oropharynx clear NECK:  Supple without lymphadenopathy or thyromegaly ABDOMEN:  Soft, non-tender, no masses EXTREMITIES:  Moves all extremities well, without clubbing, cyanosis, or edema NEUROLOGIC:  Alert and oriented x 3, normal gait, CN II-XII grossly intact MENTAL STATUS:  appropriate BACK:  Non-tender to palpation, No CVAT SKIN:  Warm, dry, and intact GU: Scrotum: wound with granulation tissue, no purulent drainage, decreased inflammation   Results: None

## 2023-07-08 ENCOUNTER — Ambulatory Visit: Payer: Managed Care, Other (non HMO) | Admitting: Urology

## 2023-07-08 NOTE — Progress Notes (Deleted)
   Assessment: 1. Scrotal abscess     Plan: Will change antibiotic coverage to levaquin based on culture results Continue dressing changes daily Return to office in 2 weeks  Chief Complaint: No chief complaint on file.   HPI: Andrew Gibson is a 37 y.o. male who presents for continued evaluation of scrotal abscess. He presented to the hospital on 06/19/2023 with a right sided scrotal abscess.  This has been present for several days with worsening inflammation and pain.  He was taken to the operating room on 06/19/2023 for incision and drainage of the scrotal abscess.  He did well following the procedure.  He remained afebrile and hemodynamically stable. Wound culture grew group B strep.  He returns today for follow-up.  He is doing well.  He continues dressing changes twice a day.  No significant drainage from the wound.  His pain is improving.  He continues on Bactrim.   Portions of the above documentation were copied from a prior visit for review purposes only.  Allergies: Allergies  Allergen Reactions   Other Nausea And Vomiting and Rash    Tomato paste Black beans- fainting     PMH: Past Medical History:  Diagnosis Date   Diabetes mellitus without complication (HCC)    Hydradenitis    Hypertension    Hypochondria    Kidney stone    OCD (obsessive compulsive disorder)     PSH: Past Surgical History:  Procedure Laterality Date   ANKLE SURGERY     HERNIA REPAIR     IRRIGATION AND DEBRIDEMENT ABSCESS N/A 06/19/2023   Procedure: IRRIGATION AND DEBRIDEMENT ABSCESS;  Surgeon: Milderd Meager., MD;  Location: [facility];  Service: Urology;  Laterality: N/A;   PILONIDAL CYST DRAINAGE     WISDOM TOOTH EXTRACTION      SH: Social History   Tobacco Use   Smoking status: Every Day    Current packs/day: 0.50    Types: Cigarettes   Smokeless tobacco: Never  Vaping Use   Vaping status: Never Used  Substance Use Topics   Alcohol use: Not Currently   Drug use: Yes     Types: Marijuana    ROS: Constitutional:  Negative for fever, chills, weight loss CV: Negative for chest pain, previous MI, hypertension Respiratory:  Negative for shortness of breath, wheezing, sleep apnea, frequent cough GI:  Negative for nausea, vomiting, bloody stool, GERD  PE: There were no vitals taken for this visit. GU:    Results: None

## 2024-01-21 ENCOUNTER — Ambulatory Visit
Admission: EM | Admit: 2024-01-21 | Discharge: 2024-01-21 | Disposition: A | Discharge status: Home / Self Care | Attending: Family Medicine | Admitting: Family Medicine

## 2024-01-21 DIAGNOSIS — H6011 Cellulitis of right external ear: Secondary | ICD-10-CM

## 2024-01-21 DIAGNOSIS — H6001 Abscess of right external ear: Secondary | ICD-10-CM | POA: Diagnosis not present

## 2024-01-21 MED ORDER — KETOROLAC TROMETHAMINE 30 MG/ML IJ SOLN
30.0000 mg | Freq: Once | INTRAMUSCULAR | Status: AC
  Administered 2024-01-21: 30 mg via INTRAMUSCULAR

## 2024-01-21 MED ORDER — MUPIROCIN 2 % EX OINT: 1.0000 | TOPICAL_OINTMENT | Freq: Two times a day (BID) | CUTANEOUS | 0 refills | Status: AC

## 2024-01-21 MED ORDER — DOXYCYCLINE HYCLATE 100 MG PO CAPS: 100.0000 mg | ORAL_CAPSULE | Freq: Two times a day (BID) | ORAL | 0 refills | Status: AC

## 2024-01-21 MED ORDER — KETOROLAC TROMETHAMINE 10 MG PO TABS: 10.0000 mg | ORAL_TABLET | Freq: Four times a day (QID) | ORAL | 0 refills | Status: AC | PRN

## 2024-01-21 NOTE — ED Provider Notes (Signed)
 [facility]    CSN: [id number] Arrival date & time: 01/21/24  4098      History   Chief Complaint Chief Complaint  Patient presents with   Skin Problem   Ear Pain    HPI Andrew Gibson is a 38 y.o. male.   HPI Here for pain and swelling of his right external ear and the tissues around it. First began bothering him 6/2, and worsened.   No f/c. Has DM, with better control in the last 6 months.  NKDA Past Medical History:  Diagnosis Date   Diabetes mellitus without complication (HCC)    Hydradenitis    Hypertension    Hypochondria    Kidney stone    OCD (obsessive compulsive disorder)     Patient Active Problem List   Diagnosis Date Noted   Scrotal abscess 06/20/2023   Perineal abscess 06/19/2023   Type 2 diabetes mellitus with hyperglycemia (HCC) 06/19/2023   Class 3 obesity 06/19/2023   Hyponatremia 06/19/2023   Hypokalemia 06/19/2023   Hypertension 09/11/2020   OCD (obsessive compulsive disorder) 09/22/2019   MDD (major depressive disorder) 09/12/2018   Class 3 severe obesity due to excess calories with serious comorbidity and body mass index (BMI) of 50.0 to 59.9 in adult 07/05/2014   Umbilical hernia without obstruction or gangrene 07/05/2014    Past Surgical History:  Procedure Laterality Date   ANKLE SURGERY     HERNIA REPAIR     IRRIGATION AND DEBRIDEMENT ABSCESS N/A 06/19/2023   Procedure: IRRIGATION AND DEBRIDEMENT ABSCESS;  Surgeon: Mellie Sprinkle., MD;  Location: [facility];  Service: Urology;  Laterality: N/A;   PILONIDAL CYST DRAINAGE     WISDOM TOOTH EXTRACTION         Home Medications    Prior to Admission medications   Medication Sig Start Date End Date Taking? Authorizing Provider  buPROPion (WELLBUTRIN XL) 150 MG 24 hr tablet Take 150 mg by mouth daily. 12/13/23  Yes [provider]  doxycycline  (VIBRAMYCIN ) 100 MG capsule Take 1 capsule (100 mg total) by mouth 2 (two) times daily for 7 days. 01/21/24 01/28/24 Yes  Amilah Greenspan, Paige Boatman, MD  Insulin  Glargine (BASAGLAR  KWIKPEN) 100 UNIT/ML Inject 25 Units into the skin daily. May substitute as needed per insurance. 06/21/23  Yes Barbee Lew, MD  insulin  lispro (HUMALOG ) 100 UNIT/ML KwikPen Inject 10 Units into the skin 3 (three) times daily. Only take if eating a meal AND Blood Glucose (BG) is 80 or higher. 06/21/23  Yes Barbee Lew, MD  ketorolac  (TORADOL ) 10 MG tablet Take 1 tablet (10 mg total) by mouth every 6 (six) hours as needed (pain). 01/21/24  Yes Ann Keto, MD  lisinopril -hydrochlorothiazide  (ZESTORETIC ) 20-12.5 MG tablet Take 2 tablets by mouth daily. 11/22/21  Yes [provider]  methocarbamol (ROBAXIN) 500 MG tablet Take 500 mg by mouth 3 (three) times daily. 09/20/23  Yes [provider]  mupirocin ointment (BACTROBAN) 2 % Apply 1 Application topically 2 (two) times daily. To affected area till better 01/21/24  Yes Walton Digilio, Paige Boatman, MD  sertraline  (ZOLOFT ) 100 MG tablet Take 200 mg by mouth daily. 11/22/21  Yes [provider]  acetaminophen  (TYLENOL ) 325 MG tablet Take 2 tablets (650 mg total) by mouth every 6 (six) hours as needed for mild pain (pain score 1-3) (or Fever >/= 101). 06/21/23   Barbee Lew, MD  atenolol  (TENORMIN ) 50 MG tablet Take 1 tablet by mouth daily. 11/22/21   [provider]  Blood Glucose Monitoring Suppl DEVI 1 each by Does not apply route 3 (three) times daily. May dispense any manufacturer covered by patient's insurance. 06/21/23   Barbee Lew, MD  Glucose Blood (BLOOD GLUCOSE TEST STRIPS) STRP 1 each by Does not apply route 3 (three) times daily. Use as directed to check blood sugar. May dispense any manufacturer covered by patient's insurance and fits patient's device. 06/21/23   Barbee Lew, MD  Insulin  Pen Needle (BD PEN NEEDLE NANO U/F) 32G X 4 MM MISC 25 Units by Does not apply route 4 (four) times daily. 06/21/23   Barbee Lew, MD   Lancet Device MISC 1 each by Does not apply route 3 (three) times daily. May dispense any manufacturer covered by patient's insurance. 06/21/23   Barbee Lew, MD  Lancets MISC 1 each by Does not apply route 3 (three) times daily. Use as directed to check blood sugar. May dispense any manufacturer covered by patient's insurance and fits patient's device. 06/21/23   Barbee Lew, MD  metFORMIN  (GLUCOPHAGE ) 1000 MG tablet Take 1 tablet by mouth 2 (two) times daily. 03/25/21   [provider]  oxyCODONE  (OXY IR/ROXICODONE ) 5 MG immediate release tablet Take 1 tablet (5 mg total) by mouth every 4 (four) hours as needed for moderate pain (pain score 4-6). 06/20/23   Stoneking, Ponce Brisker., MD    Family History Family History  Problem Relation Age of Onset   Cancer Other    Hypertension Other     Social History Social History   Tobacco Use   Smoking status: Every Day    Current packs/day: 0.50    Types: Cigarettes   Smokeless tobacco: Never  Vaping Use   Vaping status: Never Used  Substance Use Topics   Alcohol use: Yes    Comment: Occassionally.   Drug use: Yes    Types: Marijuana     Allergies   Other   Review of Systems Review of Systems   Physical Exam Triage Vital Signs ED Triage Vitals  Encounter Vitals Group     BP 01/21/24 0916 (!) 174/94     Systolic BP Percentile --      Diastolic BP Percentile --      Pulse Rate 01/21/24 0916 84     Resp 01/21/24 0916 20     Temp 01/21/24 0916 98 F (36.7 C)     Temp Source 01/21/24 0916 Oral     SpO2 01/21/24 0916 97 %     Weight 01/21/24 0913 300 lb (136.1 kg)     Height 01/21/24 0913 5' 7.5" (1.715 m)     Head Circumference --      Peak Flow --      Pain Score 01/21/24 0911 7     Pain Loc --      Pain Education --      Exclude from Growth Chart --    No data found.  Updated Vital Signs BP (!) 174/94 (BP Location: Left Arm) Comment: "I am in discomfort and always elevates when at the doctor".   Pulse 84   Temp 98 F (36.7 C) (Oral)   Resp 20   Ht 5' 7.5" (1.715 m)   Wt 136.1 kg   SpO2 97%   BMI 46.29 kg/m   Visual Acuity Right Eye Distance:   Left Eye Distance:   Bilateral Distance:    Right Eye Near:   Left Eye Near:    Bilateral  Near:     Physical Exam Vitals reviewed.  Constitutional:      General: He is not in acute distress.    Appearance: He is not ill-appearing, toxic-appearing or diaphoretic.  HENT:     Right Ear: Ear canal normal.     Ears:     Comments: There is swelling of the right ear lobe and onto the middle part of the pinna. Also some induration of the skin anterior to the pinna and inferior to it.  There is fluctuance in 2 discrete spots of the ear lobe. The more posterior one is about 1.5 cm and the anterior one at the junction with the cheek is 1 x 1.5 cm Skin:    Coloration: Skin is not pale.  Neurological:     General: No focal deficit present.     Mental Status: He is alert and oriented to person, place, and time.  Psychiatric:        Behavior: Behavior normal.      UC Treatments / Results  Labs (all labs ordered are listed, but only abnormal results are displayed) Labs Reviewed - No data to display  EKG   Radiology No results found.  Procedures Procedures (including critical care time)  Medications Ordered in UC Medications  ketorolac  (TORADOL ) 30 MG/ML injection 30 mg (30 mg Intramuscular Given 01/21/24 0957)    Initial Impression / Assessment and Plan / UC Course  I have reviewed the triage vital signs and the nursing notes.  Pertinent labs & imaging results that were available during my care of the patient were reviewed by me and considered in my medical decision making (see chart for details).      We discussed R/B of I and D of what appeared to be abscess on the ear lobe, including pain, bleeding, new infection or not obtaining any purulence on drainage. He gave verbal consent.  1% lidocaine  plain is used for  infiltration anesthesia in the more posterior/larger spot of fluctuance.  Under clean conditions #11 blade is used to make an incision in the room for what appeared to be an abscess.  Only blood was obtained.  The more anterior area is also numbed up with lidocaine  and under clean conditions a #11 blade is used to make an incision there.  Also, only blood was obtained there.  Pressure is held to control the bleeding and is successful.  Bandages applied.  Bactroban and doxycycline  are sent in.  Toradol  injections given here for the pain and Toradol  tablets are sent to the pharmacy.  He will go to the emergency room if he worsens.   Final Clinical Impressions(s) / UC Diagnoses   Final diagnoses:  Abscess of right pinna  Cellulitis of right pinna     Discharge Instructions      You have been given a shot of Toradol  30 mg today.  Ketorolac  10 mg tablets--take 1 tablet every 6 hours as needed for pain.  This is the same medicine that is in the shot we just gave you  Take doxycycline  100 mg --1 capsule 2 times daily for 7 days  Put mupirocin ointment on the sore areas twice daily until improved  If you worsen in anyway, please go to the emergency room for further evaluation and treatment.   ED Prescriptions     Medication Sig Dispense Auth. Provider   ketorolac  (TORADOL ) 10 MG tablet Take 1 tablet (10 mg total) by mouth every 6 (six) hours as needed (pain). 20 tablet  Bereket Gernert K, MD   mupirocin ointment (BACTROBAN) 2 % Apply 1 Application topically 2 (two) times daily. To affected area till better 22 g Ann Keto, MD   doxycycline  (VIBRAMYCIN ) 100 MG capsule Take 1 capsule (100 mg total) by mouth 2 (two) times daily for 7 days. 14 capsule Ellsworth Haas, Azjah Pardo K, MD      PDMP not reviewed this encounter.   Ann Keto, MD 01/21/24 [phone]

## 2024-01-21 NOTE — ED Triage Notes (Signed)
"  I have an abscess under my right ear and my whole ear is swollen and hurts really bad (outside ear). Hot to touch area". No fever known.

## 2024-01-21 NOTE — Discharge Instructions (Signed)
 You have been given a shot of Toradol  30 mg today.  Ketorolac  10 mg tablets--take 1 tablet every 6 hours as needed for pain.  This is the same medicine that is in the shot we just gave you  Take doxycycline  100 mg --1 capsule 2 times daily for 7 days  Put mupirocin ointment on the sore areas twice daily until improved  If you worsen in anyway, please go to the emergency room for further evaluation and treatment.
